# Patient Record
Sex: Female | Born: 1975 | Race: White | Hispanic: No | Marital: Married | Smoking: Never smoker
Health system: Southern US, Community
[De-identification: ages and names within clinical notes are randomized; demographics above are authoritative.]

## PROBLEM LIST (undated history)

## (undated) DIAGNOSIS — R51 Headache: Secondary | ICD-10-CM

## (undated) DIAGNOSIS — J189 Pneumonia, unspecified organism: Secondary | ICD-10-CM

## (undated) DIAGNOSIS — J45909 Unspecified asthma, uncomplicated: Secondary | ICD-10-CM

## (undated) DIAGNOSIS — G4700 Insomnia, unspecified: Secondary | ICD-10-CM

## (undated) HISTORY — DX: Insomnia, unspecified: G47.00

## (undated) HISTORY — DX: Pneumonia, unspecified organism: J18.9

## (undated) HISTORY — DX: Unspecified asthma, uncomplicated: J45.909

## (undated) HISTORY — PX: OTHER SURGICAL HISTORY: SHX169

## (undated) HISTORY — DX: Headache: R51

---

## 1997-11-30 ENCOUNTER — Other Ambulatory Visit: Admission: RE | Admit: 1997-11-30 | Discharge: 1997-11-30 | Payer: Self-pay | Admitting: Obstetrics and Gynecology

## 1998-12-01 ENCOUNTER — Other Ambulatory Visit: Admission: RE | Admit: 1998-12-01 | Discharge: 1998-12-01 | Payer: Self-pay | Admitting: Obstetrics and Gynecology

## 1999-12-13 ENCOUNTER — Other Ambulatory Visit: Admission: RE | Admit: 1999-12-13 | Discharge: 1999-12-13 | Payer: Self-pay | Admitting: *Deleted

## 2000-12-17 ENCOUNTER — Other Ambulatory Visit: Admission: RE | Admit: 2000-12-17 | Discharge: 2000-12-17 | Payer: Self-pay | Admitting: *Deleted

## 2001-10-30 ENCOUNTER — Inpatient Hospital Stay (HOSPITAL_COMMUNITY): Admission: AD | Admit: 2001-10-30 | Discharge: 2001-11-02 | Payer: Self-pay | Admitting: Obstetrics and Gynecology

## 2001-12-09 ENCOUNTER — Other Ambulatory Visit: Admission: RE | Admit: 2001-12-09 | Discharge: 2001-12-09 | Payer: Self-pay | Admitting: Obstetrics and Gynecology

## 2003-01-12 ENCOUNTER — Other Ambulatory Visit: Admission: RE | Admit: 2003-01-12 | Discharge: 2003-01-12 | Payer: Self-pay | Admitting: Obstetrics and Gynecology

## 2003-07-14 ENCOUNTER — Inpatient Hospital Stay (HOSPITAL_COMMUNITY): Admission: AD | Admit: 2003-07-14 | Discharge: 2003-07-14 | Payer: Self-pay | Admitting: Obstetrics and Gynecology

## 2003-08-03 ENCOUNTER — Encounter: Admission: RE | Admit: 2003-08-03 | Discharge: 2003-08-03 | Payer: Self-pay | Admitting: Obstetrics & Gynecology

## 2003-08-10 ENCOUNTER — Encounter: Admission: RE | Admit: 2003-08-10 | Discharge: 2003-08-10 | Payer: Self-pay | Admitting: Family Medicine

## 2003-08-17 ENCOUNTER — Encounter: Admission: RE | Admit: 2003-08-17 | Discharge: 2003-08-17 | Payer: Self-pay | Admitting: Specialist

## 2003-08-24 ENCOUNTER — Ambulatory Visit (HOSPITAL_COMMUNITY): Admission: RE | Admit: 2003-08-24 | Discharge: 2003-08-24 | Payer: Self-pay | Admitting: Obstetrics and Gynecology

## 2003-08-24 ENCOUNTER — Encounter: Admission: RE | Admit: 2003-08-24 | Discharge: 2003-08-24 | Payer: Self-pay | Admitting: *Deleted

## 2003-09-02 ENCOUNTER — Encounter: Admission: RE | Admit: 2003-09-02 | Discharge: 2003-09-02 | Payer: Self-pay | Admitting: Obstetrics & Gynecology

## 2003-09-06 ENCOUNTER — Encounter: Admission: RE | Admit: 2003-09-06 | Discharge: 2003-09-06 | Payer: Self-pay | Admitting: Obstetrics & Gynecology

## 2003-09-10 ENCOUNTER — Inpatient Hospital Stay (HOSPITAL_COMMUNITY): Admission: AD | Admit: 2003-09-10 | Discharge: 2003-09-10 | Payer: Self-pay | Admitting: Obstetrics and Gynecology

## 2003-09-14 ENCOUNTER — Encounter: Admission: RE | Admit: 2003-09-14 | Discharge: 2003-09-14 | Payer: Self-pay | Admitting: *Deleted

## 2003-09-15 ENCOUNTER — Inpatient Hospital Stay (HOSPITAL_COMMUNITY): Admission: AD | Admit: 2003-09-15 | Discharge: 2003-09-15 | Payer: Self-pay | Admitting: Obstetrics and Gynecology

## 2003-09-16 ENCOUNTER — Inpatient Hospital Stay (HOSPITAL_COMMUNITY): Admission: AD | Admit: 2003-09-16 | Discharge: 2003-09-19 | Payer: Self-pay | Admitting: Obstetrics and Gynecology

## 2003-09-16 ENCOUNTER — Encounter (INDEPENDENT_AMBULATORY_CARE_PROVIDER_SITE_OTHER): Payer: Self-pay | Admitting: Specialist

## 2003-09-23 ENCOUNTER — Inpatient Hospital Stay (HOSPITAL_COMMUNITY): Admission: AD | Admit: 2003-09-23 | Discharge: 2003-09-25 | Payer: Self-pay | Admitting: Obstetrics and Gynecology

## 2003-09-26 ENCOUNTER — Observation Stay (HOSPITAL_COMMUNITY): Admission: AD | Admit: 2003-09-26 | Discharge: 2003-09-27 | Payer: Self-pay | Admitting: Obstetrics and Gynecology

## 2003-11-01 ENCOUNTER — Other Ambulatory Visit: Admission: RE | Admit: 2003-11-01 | Discharge: 2003-11-01 | Payer: Self-pay | Admitting: Obstetrics and Gynecology

## 2004-11-28 ENCOUNTER — Other Ambulatory Visit: Admission: RE | Admit: 2004-11-28 | Discharge: 2004-11-28 | Payer: Self-pay | Admitting: Obstetrics and Gynecology

## 2007-09-22 ENCOUNTER — Emergency Department (HOSPITAL_COMMUNITY): Admission: EM | Admit: 2007-09-22 | Discharge: 2007-09-22 | Payer: Self-pay | Admitting: Family Medicine

## 2010-10-06 NOTE — H&P (Signed)
NAME:  Laura [name], Laura [name]                         ACCOUNT NO.:  [id number]   MEDICAL RECORD NO.:  [id number]                   PATIENT TYPE:  INP   LOCATION:  9374                                 FACILITY:  [facility]   PHYSICIAN:  Freddy Finner, M.D.                DATE OF BIRTH:  [birth year]   DATE OF ADMISSION:  09/23/2003  DATE OF DISCHARGE:                                HISTORY & PHYSICAL   HISTORY OF PRESENT ILLNESS:  The patient is a 35 year old white married  female, gravida 2, now para 3, who is approximately 7 days after delivery of  twins vaginally.  Her prenatal course was uncomplicated, she had  amniocentesis on September 16, 2003, with a mature LS and positive PPG; she was  induced on September 17, 2003.  She delivered without difficulty.  She was  discharged home in a routine fashion, the second or third postpartum day.  She presented to the office today complaining of constant headache,  stiffness of her neck, generalized swelling and scotomata.  A [employer]  nurse was available at the house and found her to have a blood pressure of  160/110.  She was brought to the office for further evaluation.   At that time she denied nausea, vomiting. She denied abdominal pain.  She  denied any other specific complaints, except the headache and stiff neck  primarily.  She has had a constant headache throughout the day.  Her blood  pressure in the office was 158/108.  She had 3+ protein and 3+ blood in the  urine -- this is consistent with postpartum.   She is now admitted with possible late onset pregnancy-induced hypertension,  for additional management.   REVIEW OF SYSTEMS:  Her current review of systems is as noted above.   PAST MEDICAL HISTORY:  Recorded in the prenatal summary, which should be  included in the recent old chart and will not be repeated.   PHYSICAL EXAMINATION:  HEENT:  Grossly normal.  Thyroid gland is not  palpably enlarged.  CHEST:  Clear to auscultation throughout.  HEART:  Normal sinus rhythm without murmurs, rubs or gallops.  ABDOMEN:  Soft and nontender.  There is not palpable enlargement of the  liver; no right upper quadrant tenderness.  EXTREMITIES:  Plus one edema.  Deep tendon reflexes plus two.  No evidence  of clonus.  PELVIC:  Deferred.   ASSESSMENT:  Seven days postpartum, following twin delivery -- after  uncomplicated prenatal course.  She had a normal intrapartum and immediate  postpartum course.  She is admitted now with elevation of blood pressure for  additional evaluation and management.  Freddy Finner, M.D.    WRN/MEDQ  D:  09/23/2003  T:  09/23/2003  Job:  [id number]

## 2010-10-06 NOTE — Discharge Summary (Signed)
Laura [name], Laura [name]                         ACCOUNT NO.:  [id number]   MEDICAL RECORD NO.:  [id number]                   PATIENT TYPE:  INP   LOCATION:  9374                                 FACILITY:  [facility]   PHYSICIAN:  Juluis Mire, M.D.                DATE OF BIRTH:  [birth year]   DATE OF ADMISSION:  09/23/2003  DATE OF DISCHARGE:  09/25/2003                                 DISCHARGE SUMMARY   ADMITTING DIAGNOSES:  1. Postpartum pregnancy-induced hypertension.  2. Worsening head and neck pain.   DISCHARGE DIAGNOSES:  1. Postpartum pregnancy-induced hypertension.  2. Worsening head and neck pain, with the finding of a probable muscular     strain of the neck.   HISTORY AND PHYSICAL:  For a complete history and physical, please see  written note.   COURSE IN THE HOSPITAL:  The patient was brought in and begun on magnesium  sulfate.  PIH panels were obtained, and were basically unremarkable, except  for a minimally elevated LDH.  This did resolve on follow up blood work.  She continued to complain of mainly neck discomfort, and was placed on  Motrin and Flexeril and heat with marked improved.  Subsequent CT scan of  the head and neck were completely negative.  On Sep 25, 2003, she was doing  much better.  Her blood pressure seemed to stabilize.  It was also of note  that she was placed on Labetalol early on due to elevated blood pressures  with improvement.  The magnesium was discontinued on Sep 25, 2003.  She was  voiding well and diuresing.  She denies any pregnancy-induced hypertension  symptoms, particularly no headache, scatoma, or right upper quadrant pain.  Off the magnesium she did well, blood pressure remained stable, and she was  discharged home to continue Labetalol.  She will follow up early next week  for reassessment of blood pressure.  In terms of complications, none  encountered during stay in the hospital.   CONDITION ON DISCHARGE:  The patient was discharged  home in stable  condition.   DISPOSITION:  The patient is to watch for signs of worsening pregnancy-  induced hypertension including worsening headaches, scatoma, right upper  quadrant pain, or edema.   DISCHARGE MEDICATIONS:  Discharged home on Labetalol.   FOLLOW UP:  Will follow up early next week for reassessment.                                               Juluis Mire, M.D.    JSM/MEDQ  D:  09/25/2003  T:  09/26/2003  Job:  [id number]

## 2010-10-06 NOTE — H&P (Signed)
[facility]  Patient:    Laura [name], BAGLIO Visit Number: [id number] MRN: [id number]          Service Type: OBS Location: 910B 9164 01 Attending Physician:  Donne Hazel Dictated by:   Willey Blade, M.D. Admit Date:  10/30/2001                           History and Physical  SOCIAL SECURITY NUMBER:       [id number]  HISTORY OF PRESENT ILLNESS:   The patient is a 35 year old female gravida 1 at [redacted] weeks gestation.  The patient is admitted at this time to undergo elective induction at term.  Her pregnancy has gone fairly well without major complications.  OBSTETRICAL LABORATORY DATA:  Maternal blood type O positive.  Rubella immune. Glucola normal.  Group B strep negative.  MEDICAL HISTORY:              History of asthma.  SURGICAL HISTORY:             1. Wisdom teeth extraction.                               2. Nasal polypectomy in 1993.                               3. Gum recession repair in 2002.  CURRENT MEDICATIONS:          Prenatal vitamins.  ALLERGIES:                    No known drug allergies.  PHYSICAL EXAMINATION:  VITAL SIGNS:                  Stable, blood pressure 110/70, fetal heart tones are positive in the 130s to 140s and reassuring.  GENERAL:                      She is a well-developed, well-nourished female in no acute distress.  HEENT:                        Within normal limits.  NECK:                         Supple without adenopathy or thyromegaly.  HEART:                        Regular rate and rhythm without murmur, gallop, or rub.  LUNGS:                        Clear to auscultation.  BREAST:                       Deferred.  ABDOMEN:                      Gravid and nontender.  EXTREMITIES AND NEUROLOGIC:   Grossly normal.  PELVIC:                       Normal external female genitalia.  Cervix is closed, thick and high.  Vertex presentation is noted.  ADMITTING DIAGNOSIS:  1.  Intrauterine pregnancy at term.                               2. Elective induction.  PLAN:                         1. Serial induction of labor.                               2. Anticipate normal spontaneous vaginal                                  delivery. Dictated by:   Willey Blade, M.D. Attending Physician:  Donne Hazel DD:  10/30/01 TD:  10/30/01 Job: [id number]

## 2012-02-15 ENCOUNTER — Other Ambulatory Visit: Payer: Self-pay | Admitting: Dermatology

## 2012-08-25 ENCOUNTER — Encounter: Payer: Self-pay | Admitting: Pulmonary Disease

## 2012-08-26 ENCOUNTER — Institutional Professional Consult (permissible substitution): Payer: Self-pay | Admitting: Pulmonary Disease

## 2015-04-21 ENCOUNTER — Ambulatory Visit (INDEPENDENT_AMBULATORY_CARE_PROVIDER_SITE_OTHER): Payer: BLUE CROSS/BLUE SHIELD | Admitting: Sports Medicine

## 2015-04-21 VITALS — BP 100/60 | Ht 66.0 in | Wt 135.0 lb

## 2015-04-21 DIAGNOSIS — R269 Unspecified abnormalities of gait and mobility: Secondary | ICD-10-CM

## 2015-04-21 NOTE — Assessment & Plan Note (Addendum)
Patient was fitted for a standard, cushioned, semi-rigid orthotic. The orthotic was heated and afterward the patient stood on the orthotic blank positioned on the orthotic stand. The patient was positioned in subtalar neutral position and 10 degrees of ankle dorsiflexion in a weight bearing stance. After completion of molding, a stable base was applied to the orthotic blank. The blank was ground to a stable position for weight bearing. Size: 8 Base: Blue EVA Additional Posting and Padding: 1st MTP post  The patient ambulated these, and they were very comfortable.  I spent 45 minutes with this patient, greater than 50% was face-to-face time counseling regarding the below diagnosis.

## 2015-04-21 NOTE — Progress Notes (Signed)
  Laura [name] - 39 y.o. female MRN [id number]  Date of birth: 07/05/1975 Laura [name] is a 39 y.o. female who presents today for gait abnormality and foot pain.  Ongoing foot pain 2/2 running technique.  Previous orthotics have helped somewhat but continues to have plantar foot pain.  Denies paresthesias distally or injury at this time.    PMHx - Updated and reviewed.  Contributory factors include: non contributory PSHx - Updated and reviewed.  Contributory factors include:  Non contributory  FHx - Updated and reviewed.  Contributory factors include:  Non contributory  Medications - Denies    ROS Per HPI.  12 point negative other than per HPI.   Exam:  There were no vitals filed for this visit. Gen: NAD, AAO 3 Cardiorespiratory - Normal respiratory effort/rate.  RRR Skin: No rashes or erythema Extremities: No edema, pulses +2 bilateral upper and lower extremity Feet:

## 2016-02-16 DIAGNOSIS — J111 Influenza due to unidentified influenza virus with other respiratory manifestations: Secondary | ICD-10-CM | POA: Diagnosis not present

## 2016-02-16 DIAGNOSIS — Z6826 Body mass index (BMI) 26.0-26.9, adult: Secondary | ICD-10-CM | POA: Diagnosis not present

## 2016-02-16 DIAGNOSIS — R509 Fever, unspecified: Secondary | ICD-10-CM | POA: Diagnosis not present

## 2016-02-22 DIAGNOSIS — R002 Palpitations: Secondary | ICD-10-CM | POA: Diagnosis not present

## 2016-02-22 DIAGNOSIS — Z6826 Body mass index (BMI) 26.0-26.9, adult: Secondary | ICD-10-CM | POA: Diagnosis not present

## 2016-02-22 DIAGNOSIS — J45909 Unspecified asthma, uncomplicated: Secondary | ICD-10-CM | POA: Diagnosis not present

## 2016-04-05 DIAGNOSIS — D485 Neoplasm of uncertain behavior of skin: Secondary | ICD-10-CM | POA: Diagnosis not present

## 2016-06-21 DIAGNOSIS — Z91013 Allergy to seafood: Secondary | ICD-10-CM | POA: Diagnosis not present

## 2016-06-21 DIAGNOSIS — J301 Allergic rhinitis due to pollen: Secondary | ICD-10-CM | POA: Diagnosis not present

## 2016-06-21 DIAGNOSIS — K219 Gastro-esophageal reflux disease without esophagitis: Secondary | ICD-10-CM | POA: Diagnosis not present

## 2016-06-21 DIAGNOSIS — J4540 Moderate persistent asthma, uncomplicated: Secondary | ICD-10-CM | POA: Diagnosis not present

## 2016-09-10 DIAGNOSIS — Z01419 Encounter for gynecological examination (general) (routine) without abnormal findings: Secondary | ICD-10-CM | POA: Diagnosis not present

## 2016-09-10 DIAGNOSIS — Z1231 Encounter for screening mammogram for malignant neoplasm of breast: Secondary | ICD-10-CM | POA: Diagnosis not present

## 2016-09-10 DIAGNOSIS — Z6822 Body mass index (BMI) 22.0-22.9, adult: Secondary | ICD-10-CM | POA: Diagnosis not present

## 2017-01-02 DIAGNOSIS — Z30432 Encounter for removal of intrauterine contraceptive device: Secondary | ICD-10-CM | POA: Diagnosis not present

## 2017-01-24 DIAGNOSIS — J3081 Allergic rhinitis due to animal (cat) (dog) hair and dander: Secondary | ICD-10-CM | POA: Diagnosis not present

## 2017-01-24 DIAGNOSIS — K219 Gastro-esophageal reflux disease without esophagitis: Secondary | ICD-10-CM | POA: Diagnosis not present

## 2017-01-24 DIAGNOSIS — J301 Allergic rhinitis due to pollen: Secondary | ICD-10-CM | POA: Diagnosis not present

## 2017-01-24 DIAGNOSIS — Z91013 Allergy to seafood: Secondary | ICD-10-CM | POA: Diagnosis not present

## 2017-03-03 DIAGNOSIS — M545 Low back pain: Secondary | ICD-10-CM | POA: Diagnosis not present

## 2017-04-05 DIAGNOSIS — F418 Other specified anxiety disorders: Secondary | ICD-10-CM | POA: Diagnosis not present

## 2017-04-09 DIAGNOSIS — M545 Low back pain: Secondary | ICD-10-CM | POA: Diagnosis not present

## 2017-04-16 DIAGNOSIS — M545 Low back pain: Secondary | ICD-10-CM | POA: Diagnosis not present

## 2017-04-16 DIAGNOSIS — M5126 Other intervertebral disc displacement, lumbar region: Secondary | ICD-10-CM | POA: Diagnosis not present

## 2017-04-18 DIAGNOSIS — M545 Low back pain: Secondary | ICD-10-CM | POA: Diagnosis not present

## 2017-04-18 DIAGNOSIS — M5126 Other intervertebral disc displacement, lumbar region: Secondary | ICD-10-CM | POA: Diagnosis not present

## 2017-08-29 DIAGNOSIS — J301 Allergic rhinitis due to pollen: Secondary | ICD-10-CM | POA: Diagnosis not present

## 2017-08-29 DIAGNOSIS — K219 Gastro-esophageal reflux disease without esophagitis: Secondary | ICD-10-CM | POA: Diagnosis not present

## 2017-08-29 DIAGNOSIS — J4540 Moderate persistent asthma, uncomplicated: Secondary | ICD-10-CM | POA: Diagnosis not present

## 2017-08-29 DIAGNOSIS — Z91013 Allergy to seafood: Secondary | ICD-10-CM | POA: Diagnosis not present

## 2017-12-04 DIAGNOSIS — F418 Other specified anxiety disorders: Secondary | ICD-10-CM | POA: Diagnosis not present

## 2017-12-04 DIAGNOSIS — F439 Reaction to severe stress, unspecified: Secondary | ICD-10-CM | POA: Diagnosis not present

## 2017-12-17 DIAGNOSIS — F439 Reaction to severe stress, unspecified: Secondary | ICD-10-CM | POA: Diagnosis not present

## 2017-12-17 DIAGNOSIS — F418 Other specified anxiety disorders: Secondary | ICD-10-CM | POA: Diagnosis not present

## 2018-01-01 DIAGNOSIS — J45909 Unspecified asthma, uncomplicated: Secondary | ICD-10-CM | POA: Diagnosis not present

## 2018-01-01 DIAGNOSIS — J029 Acute pharyngitis, unspecified: Secondary | ICD-10-CM | POA: Diagnosis not present

## 2018-01-01 DIAGNOSIS — J302 Other seasonal allergic rhinitis: Secondary | ICD-10-CM | POA: Diagnosis not present

## 2018-01-08 DIAGNOSIS — Z01419 Encounter for gynecological examination (general) (routine) without abnormal findings: Secondary | ICD-10-CM | POA: Diagnosis not present

## 2018-01-08 DIAGNOSIS — Z1231 Encounter for screening mammogram for malignant neoplasm of breast: Secondary | ICD-10-CM | POA: Diagnosis not present

## 2018-01-08 DIAGNOSIS — Z6823 Body mass index (BMI) 23.0-23.9, adult: Secondary | ICD-10-CM | POA: Diagnosis not present

## 2018-01-22 DIAGNOSIS — B029 Zoster without complications: Secondary | ICD-10-CM | POA: Diagnosis not present

## 2018-01-22 DIAGNOSIS — F439 Reaction to severe stress, unspecified: Secondary | ICD-10-CM | POA: Diagnosis not present

## 2018-01-22 DIAGNOSIS — F418 Other specified anxiety disorders: Secondary | ICD-10-CM | POA: Diagnosis not present

## 2018-01-29 DIAGNOSIS — B0239 Other herpes zoster eye disease: Secondary | ICD-10-CM | POA: Diagnosis not present

## 2018-02-06 DIAGNOSIS — F439 Reaction to severe stress, unspecified: Secondary | ICD-10-CM | POA: Diagnosis not present

## 2018-02-06 DIAGNOSIS — F418 Other specified anxiety disorders: Secondary | ICD-10-CM | POA: Diagnosis not present

## 2018-03-05 DIAGNOSIS — F418 Other specified anxiety disorders: Secondary | ICD-10-CM | POA: Diagnosis not present

## 2018-03-05 DIAGNOSIS — F439 Reaction to severe stress, unspecified: Secondary | ICD-10-CM | POA: Diagnosis not present

## 2018-03-14 DIAGNOSIS — F418 Other specified anxiety disorders: Secondary | ICD-10-CM | POA: Diagnosis not present

## 2018-03-14 DIAGNOSIS — F439 Reaction to severe stress, unspecified: Secondary | ICD-10-CM | POA: Diagnosis not present

## 2018-04-01 DIAGNOSIS — F439 Reaction to severe stress, unspecified: Secondary | ICD-10-CM | POA: Diagnosis not present

## 2018-04-01 DIAGNOSIS — F418 Other specified anxiety disorders: Secondary | ICD-10-CM | POA: Diagnosis not present

## 2018-04-09 DIAGNOSIS — D224 Melanocytic nevi of scalp and neck: Secondary | ICD-10-CM | POA: Diagnosis not present

## 2018-04-09 DIAGNOSIS — D2261 Melanocytic nevi of right upper limb, including shoulder: Secondary | ICD-10-CM | POA: Diagnosis not present

## 2018-04-09 DIAGNOSIS — Z85828 Personal history of other malignant neoplasm of skin: Secondary | ICD-10-CM | POA: Diagnosis not present

## 2018-04-09 DIAGNOSIS — D485 Neoplasm of uncertain behavior of skin: Secondary | ICD-10-CM | POA: Diagnosis not present

## 2018-04-09 DIAGNOSIS — D225 Melanocytic nevi of trunk: Secondary | ICD-10-CM | POA: Diagnosis not present

## 2018-04-09 DIAGNOSIS — D2262 Melanocytic nevi of left upper limb, including shoulder: Secondary | ICD-10-CM | POA: Diagnosis not present

## 2018-05-02 DIAGNOSIS — M545 Low back pain: Secondary | ICD-10-CM | POA: Diagnosis not present

## 2018-05-02 DIAGNOSIS — M5126 Other intervertebral disc displacement, lumbar region: Secondary | ICD-10-CM | POA: Diagnosis not present

## 2018-06-05 DIAGNOSIS — M5136 Other intervertebral disc degeneration, lumbar region: Secondary | ICD-10-CM | POA: Diagnosis not present

## 2018-06-19 DIAGNOSIS — M5136 Other intervertebral disc degeneration, lumbar region: Secondary | ICD-10-CM | POA: Diagnosis not present

## 2018-06-19 DIAGNOSIS — M545 Low back pain: Secondary | ICD-10-CM | POA: Diagnosis not present

## 2018-07-03 DIAGNOSIS — M5440 Lumbago with sciatica, unspecified side: Secondary | ICD-10-CM | POA: Diagnosis not present

## 2018-07-17 ENCOUNTER — Telehealth: Payer: Self-pay | Admitting: Cardiology

## 2018-07-17 DIAGNOSIS — M545 Low back pain: Secondary | ICD-10-CM | POA: Diagnosis not present

## 2018-07-17 NOTE — Telephone Encounter (Signed)
Spoke with pt, Follow up scheduled  

## 2018-07-17 NOTE — Telephone Encounter (Signed)
Follow Up    PT is calling back to speak with Debra. She would like to schedule an appt.  [phone]

## 2018-07-17 NOTE — Telephone Encounter (Signed)
New Message ° ° °Patient returning Debra's call. °

## 2018-07-17 NOTE — Telephone Encounter (Signed)
Pt called back to talk to [facility]. Please call at your earliest convenience

## 2018-07-18 ENCOUNTER — Ambulatory Visit: Payer: BLUE CROSS/BLUE SHIELD | Admitting: Cardiology

## 2018-07-18 ENCOUNTER — Encounter: Payer: Self-pay | Admitting: Cardiology

## 2018-07-18 VITALS — BP 104/76 | HR 69 | Ht 66.0 in | Wt 155.0 lb

## 2018-07-18 DIAGNOSIS — R002 Palpitations: Secondary | ICD-10-CM

## 2018-07-18 NOTE — Progress Notes (Signed)
Referring-Ravisankar Avva MD Reason for referral-palpitations  HPI: 43 year old female for evaluation of palpitations at request of Ravisankar Avva MD. Patient has had intermittent palpitations since college.  They are described as a brief flutter and not sustained.  Had an episode recently lasting 5 seconds.  Otherwise very active and denies dyspnea on exertion, orthopnea, PND, pedal edema, syncope or exertional chest pain.  Current Outpatient Medications  Medication Sig Dispense Refill  . SYMBICORT 160-4.5 MCG/ACT inhaler   4   No current facility-administered medications for this visit.     Allergies  Allergen Reactions  . Shellfish Allergy Hives     Past Medical History:  Diagnosis Date  . Asthma   . Headache(784.0)   . Insomnia   . Pneumonia     Past Surgical History:  Procedure Laterality Date  . nasal polyp removal      Social History   Socioeconomic History  . Marital status: Married    Spouse name: Not on file  . Number of children: 3  . Years of education: Not on file  . Highest education level: Not on file  Occupational History  . Not on file  Social Needs  . Financial resource strain: Not on file  . Food insecurity:    Worry: Not on file    Inability: Not on file  . Transportation needs:    Medical: Not on file    Non-medical: Not on file  Tobacco Use  . Smoking status: Never Smoker  . Smokeless tobacco: Never Used  Substance and Sexual Activity  . Alcohol use: Yes    Comment: Occasional  . Drug use: Yes  . Sexual activity: Not on file  Lifestyle  . Physical activity:    Days per week: Not on file    Minutes per session: Not on file  . Stress: Not on file  Relationships  . Social connections:    Talks on phone: Not on file    Gets together: Not on file    Attends religious service: Not on file    Active member of club or organization: Not on file    Attends meetings of clubs or organizations: Not on file    Relationship status:  Not on file  . Intimate partner violence:    Fear of current or ex partner: Not on file    Emotionally abused: Not on file    Physically abused: Not on file    Forced sexual activity: Not on file  Other Topics Concern  . Not on file  Social History Narrative  . Not on file    Family History  Problem Relation Age of Onset  . Asthma Father   . Ulcers Father   . Cancer Other        grandparents  . Asthma Sister   . Asthma Sister   . Alzheimer's disease Mother     ROS: no fevers or chills, productive cough, hemoptysis, dysphasia, odynophagia, melena, hematochezia, dysuria, hematuria, rash, seizure activity, orthopnea, PND, pedal edema, claudication. Remaining systems are negative.  Physical Exam:   Blood pressure 104/76, pulse 69, height 5\' 6"  (1.676 m), weight 155 lb (70.3 kg).  General:  Well developed/well nourished in NAD Skin warm/dry Patient not depressed No peripheral clubbing Back-normal HEENT-normal/normal eyelids Neck supple/normal carotid upstroke bilaterally; no bruits; no JVD; no thyromegaly chest - CTA/ normal expansion CV - RRR/normal S1 and S2; no murmurs, rubs or gallops;  PMI nondisplaced Abdomen -NT/ND, no HSM, no mass, + bowel  sounds, no bruit 2+ femoral pulses, no bruits Ext-no edema, chords, 2+ DP Neuro-grossly nonfocal  ECG -sinus rhythm at a rate of 69, no ST changes.  Personally reviewed  A/P  1 palpitations-etiology unclear.  Electrocardiogram is normal.  I will arrange an echocardiogram to assess LV function.  Check TSH.  We will place an event monitor to further assess.  Can consider beta-blocker in the future if symptoms worsen.  2 asthma-management per primary care.  Olga Millers, MD

## 2018-07-18 NOTE — Patient Instructions (Signed)
Medication Instructions:  NO CHANGE If you need a refill on your cardiac medications before your next appointment, please call your pharmacy.   Lab work: Your physician recommends that you HAVE LAB WORK TODAY If you have labs (blood work) drawn today and your tests are completely normal, you will receive your results only by: Marland Kitchen MyChart Message (if you have MyChart) OR . A paper copy in the mail If you have any lab test that is abnormal or we need to change your treatment, we will call you to review the results.  Testing/Procedures: Your physician has requested that you have an echocardiogram. Echocardiography is a painless test that uses sound waves to create images of your heart. It provides your doctor with information about the size and shape of your heart and how well your heart's chambers and valves are working. This procedure takes approximately one hour. There are no restrictions for this procedure.  [address] [facility]  Your physician has recommended that you wear an event monitor. Event monitors are medical devices that record the heart's electrical activity. Doctors most often [place] these monitors to diagnose arrhythmias. Arrhythmias are problems with the speed or rhythm of the heartbeat. The monitor is a small, portable device. You can wear one while you do your normal daily activities. This is usually used to diagnose what is causing palpitations/syncope (passing out).  [address]  Follow-Up: At [facility], you and your health needs are our priority.  As part of our continuing mission to provide you with exceptional heart care, we have created designated Provider Care Teams.  These Care Teams include your primary Cardiologist (physician) and Advanced Practice Providers (APPs -  Physician Assistants and Nurse Practitioners) who all work together to provide you with the care you need, when you need it. Your physician recommends that you schedule a follow-up  appointment in: 3 MONTHS WITH DR Jens Som

## 2018-07-19 LAB — TSH: TSH: 1.94 u[IU]/mL (ref 0.450–4.500)

## 2018-07-21 ENCOUNTER — Encounter: Payer: Self-pay | Admitting: *Deleted

## 2018-07-29 ENCOUNTER — Ambulatory Visit (INDEPENDENT_AMBULATORY_CARE_PROVIDER_SITE_OTHER): Payer: BLUE CROSS/BLUE SHIELD

## 2018-07-29 ENCOUNTER — Ambulatory Visit (HOSPITAL_COMMUNITY): Payer: BLUE CROSS/BLUE SHIELD | Attending: Cardiovascular Disease

## 2018-07-29 DIAGNOSIS — R002 Palpitations: Secondary | ICD-10-CM | POA: Diagnosis not present

## 2018-10-10 ENCOUNTER — Ambulatory Visit: Payer: BLUE CROSS/BLUE SHIELD | Admitting: Cardiology

## 2018-10-10 NOTE — Progress Notes (Deleted)
Virtual Visit via Video Note   This visit type was conducted due to national recommendations for restrictions regarding the COVID-19 Pandemic (e.g. social distancing) in an effort to limit this patient's exposure and mitigate transmission in our community.  Due to her co-morbid illnesses, this patient is at least at moderate risk for complications without adequate follow up.  This format is felt to be most appropriate for this patient at this time.  All issues noted in this document were discussed and addressed.  A limited physical exam was performed with this format.  Please refer to the patient's chart for her consent to telehealth for [facility].   Date:  10/10/2018   ID:  Laura [name], DOB 01-10-1976, MRN [id number]  Patient Location: Home Provider Location: Home  PCP:  Chilton Greathouse, MD  Cardiologist:  Dr Jens Som  Evaluation Performed:  Follow-Up Visit  Chief Complaint:  FU palpitations  History of Present Illness:    FU palpitations.   TSH February 2020 normal.  Echocardiogram March 2020 showed normal LV function.  Monitor March 2020 showed rare PAC and PVC.  Since last seen  The patient does not have symptoms concerning for COVID-19 infection (fever, chills, cough, or new shortness of breath).    Past Medical History:  Diagnosis Date  . Asthma   . Headache(784.0)   . Insomnia   . Pneumonia    Past Surgical History:  Procedure Laterality Date  . nasal polyp removal       No outpatient medications have been marked as taking for the 10/10/18 encounter (Appointment) with Lewayne Bunting, MD.     Allergies:   Shellfish allergy   Social History   Tobacco Use  . Smoking status: Never Smoker  . Smokeless tobacco: Never Used  Substance Use Topics  . Alcohol use: Yes    Comment: Occasional  . Drug use: Yes     Family Hx: The patient's family history includes Alzheimer's disease in her mother; Asthma in her father, sister, and sister; Cancer in an other  family member; Ulcers in her father.  ROS:   Please see the history of present illness.    No fevers, chills or productive cough. All other systems reviewed and are negative.  Recent Labs: 07/18/2018: TSH 1.940    Wt Readings from Last 3 Encounters:  07/18/18 155 lb (70.3 kg)  04/21/15 135 lb (61.2 kg)     Objective:    Vital Signs:  There were no vitals taken for this visit.   VITAL SIGNS:  reviewed  No acute distress Normal affect Answers questions appropriately  remainder physical examination not performed (telehealth visit; coronavirus pandemic)  ASSESSMENT & PLAN:    1. Palpitations-symptoms have improved.  Echocardiogram shows normal LV function and monitor shows occasional PAC and PVC.  We can consider a beta-blocker in the future if needed. 2. Asthma-management per primary care.  COVID-19 Education: The importance of social distancing was discussed today.  Time:   Today, I have spent 12 minutes with the patient with telehealth technology discussing the above problems.     Medication Adjustments/Labs and Tests Ordered: Current medicines are reviewed at length with the patient today.  Concerns regarding medicines are outlined above.   Tests Ordered: No orders of the defined types were placed in this encounter.   Medication Changes: No orders of the defined types were placed in this encounter.   Disposition:  Follow up {follow up:15908}  Signed, Olga Millers, MD  10/10/2018 7:39 AM  [facility]

## 2018-10-16 DIAGNOSIS — F418 Other specified anxiety disorders: Secondary | ICD-10-CM | POA: Diagnosis not present

## 2018-10-16 DIAGNOSIS — F439 Reaction to severe stress, unspecified: Secondary | ICD-10-CM | POA: Diagnosis not present

## 2019-01-08 ENCOUNTER — Other Ambulatory Visit: Payer: Self-pay

## 2019-01-08 ENCOUNTER — Encounter (HOSPITAL_COMMUNITY): Payer: Self-pay | Admitting: Emergency Medicine

## 2019-01-08 DIAGNOSIS — R1031 Right lower quadrant pain: Secondary | ICD-10-CM | POA: Insufficient documentation

## 2019-01-08 DIAGNOSIS — J45909 Unspecified asthma, uncomplicated: Secondary | ICD-10-CM | POA: Diagnosis not present

## 2019-01-08 DIAGNOSIS — K529 Noninfective gastroenteritis and colitis, unspecified: Secondary | ICD-10-CM | POA: Diagnosis not present

## 2019-01-08 DIAGNOSIS — R197 Diarrhea, unspecified: Secondary | ICD-10-CM | POA: Diagnosis not present

## 2019-01-08 MED ORDER — SODIUM CHLORIDE 0.9% FLUSH: 3.0000 mL | Freq: Once | INTRAVENOUS | Status: DC

## 2019-01-08 NOTE — ED Triage Notes (Signed)
Patient states she has not been feeling good today. Patient states that she started having severe right upper abdominal pain around nine pm. Patient states she also has had diarrhea.

## 2019-01-09 ENCOUNTER — Emergency Department (HOSPITAL_COMMUNITY)
Admission: EM | Admit: 2019-01-09 | Discharge: 2019-01-09 | Disposition: A | Discharge status: Home / Self Care | Payer: BC Managed Care – PPO | Attending: Emergency Medicine | Admitting: Emergency Medicine

## 2019-01-09 ENCOUNTER — Encounter (HOSPITAL_COMMUNITY): Payer: Self-pay

## 2019-01-09 ENCOUNTER — Emergency Department (HOSPITAL_COMMUNITY): Payer: BC Managed Care – PPO

## 2019-01-09 DIAGNOSIS — R1031 Right lower quadrant pain: Secondary | ICD-10-CM

## 2019-01-09 DIAGNOSIS — R197 Diarrhea, unspecified: Secondary | ICD-10-CM | POA: Diagnosis not present

## 2019-01-09 DIAGNOSIS — K529 Noninfective gastroenteritis and colitis, unspecified: Secondary | ICD-10-CM | POA: Diagnosis not present

## 2019-01-09 LAB — COMPREHENSIVE METABOLIC PANEL
ALT: 19 U/L (ref 0–44)
AST: 21 U/L (ref 15–41)
Albumin: 4.1 g/dL (ref 3.5–5.0)
Alkaline Phosphatase: 59 U/L (ref 38–126)
Anion gap: 8 (ref 5–15)
BUN: 17 mg/dL (ref 6–20)
CO2: 22 mmol/L (ref 22–32)
Calcium: 9 mg/dL (ref 8.9–10.3)
Chloride: 106 mmol/L (ref 98–111)
Creatinine, Ser: 0.82 mg/dL (ref 0.44–1.00)
GFR calc Af Amer: >60 mL/min (ref >60)
GFR calc non Af Amer: >60 mL/min (ref >60)
Glucose, Bld: 121 mg/dL — ABNORMAL HIGH (ref 70–99)
Potassium: 3.7 mmol/L (ref 3.5–5.1)
Sodium: 136 mmol/L (ref 135–145)
Total Bilirubin: 0.6 mg/dL (ref 0.3–1.2)
Total Protein: 7.2 g/dL (ref 6.5–8.1)

## 2019-01-09 LAB — URINALYSIS, ROUTINE W REFLEX MICROSCOPIC
Bacteria, UA: NONE SEEN
Bilirubin Urine: NEGATIVE
Glucose, UA: NEGATIVE mg/dL
Hgb urine dipstick: NEGATIVE
Ketones, ur: NEGATIVE mg/dL
Nitrite: NEGATIVE
Protein, ur: NEGATIVE mg/dL
Specific Gravity, Urine: 1.021 (ref 1.005–1.030)
pH: 7 (ref 5.0–8.0)

## 2019-01-09 LAB — I-STAT BETA HCG BLOOD, ED (MC, WL, AP ONLY): I-stat hCG, quantitative: <5 m[IU]/mL (ref <5)

## 2019-01-09 LAB — CBC
HCT: 42.8 % (ref 36.0–46.0)
Hemoglobin: 14.1 g/dL (ref 12.0–15.0)
MCH: 31.5 pg (ref 26.0–34.0)
MCHC: 32.9 g/dL (ref 30.0–36.0)
MCV: 95.5 fL (ref 80.0–100.0)
Platelets: 144 10*3/uL — ABNORMAL LOW (ref 150–400)
RBC: 4.48 MIL/uL (ref 3.87–5.11)
RDW: 12.1 % (ref 11.5–15.5)
WBC: 4.8 10*3/uL (ref 4.0–10.5)
nRBC: 0 % (ref 0.0–0.2)

## 2019-01-09 LAB — LIPASE, BLOOD: Lipase: 34 U/L (ref 11–51)

## 2019-01-09 MED ORDER — SODIUM CHLORIDE 0.9 % IV BOLUS
1000.0000 mL | Freq: Once | INTRAVENOUS | Status: AC
  Administered 2019-01-09: 1000 mL via INTRAVENOUS

## 2019-01-09 MED ORDER — IOHEXOL 300 MG/ML  SOLN
100.0000 mL | Freq: Once | INTRAMUSCULAR | Status: AC | PRN
  Administered 2019-01-09: 02:00:00 100 mL via INTRAVENOUS

## 2019-01-09 MED ORDER — LACTINEX PO CHEW: 1.0000 | CHEWABLE_TABLET | Freq: Three times a day (TID) | ORAL | 0 refills | Status: AC

## 2019-01-09 MED ORDER — SODIUM CHLORIDE (PF) 0.9 % IJ SOLN
INTRAMUSCULAR | Status: AC
  Filled 2019-01-09: qty 50

## 2019-01-09 MED ORDER — ONDANSETRON 4 MG PO TBDP: 4.0000 mg | ORAL_TABLET | Freq: Three times a day (TID) | ORAL | 0 refills | Status: AC | PRN

## 2019-01-09 NOTE — ED Provider Notes (Signed)
[facility] DEPT Provider Note   CSN: [id number] Arrival date & time: 01/08/19  2300     History   Chief Complaint Chief Complaint  Patient presents with  . Abdominal Pain    HPI Laura [name] is [name] 43 y.o. female.     43yo female with past medical history of asthma presents with complaint of right side abdominal pain.  Patient states that she has not felt well all day today, reports nausea and diarrhea.  Patient ate [name] burger for dinner tonight, when she was getting ready to go to bed she developed sudden onset of severe right-sided abdominal pain described as [name] sharp labor pain.  Pain radiates down towards the lower right abdomen, was worse with deep breaths.  Pain has eased somewhat at this point but is still uncomfortable.  No changes in bladder habits, no known sick contacts, no recent travel, no history of similar pain previously.  No prior abdominal surgeries.  No other complaints or concerns.     Past Medical History:  Diagnosis Date  . Asthma   . Headache(784.0)   . Insomnia   . Pneumonia     Patient Active Problem List   Diagnosis Date Noted  . Abnormality of gait 04/21/2015    Past Surgical History:  Procedure Laterality Date  . nasal polyp removal       OB History   No obstetric history on file.      Home Medications    Prior to Admission medications   Medication Sig Start Date End Date Taking? Authorizing Provider  SYMBICORT 160-4.5 MCG/ACT inhaler  01/17/15   [provider]    Family History Family History  Problem Relation Age of Onset  . Asthma Father   . Ulcers Father   . Cancer Other        grandparents  . Asthma Sister   . Asthma Sister   . Alzheimer's disease Mother     Social History Social History   Tobacco Use  . Smoking status: Never Smoker  . Smokeless tobacco: Never Used  Substance Use Topics  . Alcohol use: Yes    Comment: Occasional  . Drug use: Yes     Allergies    Shellfish allergy   Review of Systems Review of Systems  Constitutional: Negative for chills and fever.  Respiratory: Negative for shortness of breath.   Cardiovascular: Negative for chest pain.  Gastrointestinal: Positive for abdominal pain, diarrhea and nausea. Negative for constipation and vomiting.  Genitourinary: Negative for dysuria, frequency and urgency.  Musculoskeletal: Negative for back pain.  Skin: Negative for rash and wound.  Allergic/Immunologic: Negative for immunocompromised state.  Neurological: Negative for weakness.  Psychiatric/Behavioral: Negative for confusion.  All other systems reviewed and are negative.    Physical Exam Updated Vital Signs BP 121/72 (BP Location: Left Arm)   Pulse 77   Temp 98.3 F (36.8 C) (Oral)   Resp 16   Ht 5\' 6"  (1.676 m)   Wt 65.8 kg   SpO2 100%   BMI 23.40 kg/m   Physical Exam Vitals signs and nursing note reviewed.  Constitutional:      General: She is not in acute distress.    Appearance: She is well-developed. She is not diaphoretic.  HENT:     Head: Normocephalic and atraumatic.  Cardiovascular:     Rate and Rhythm: Normal rate and regular rhythm.     Heart sounds: Normal heart sounds.  Pulmonary:  Effort: Pulmonary effort is normal.  Abdominal:     General: Abdomen is flat.     Palpations: Abdomen is soft.     Tenderness: There is abdominal tenderness in the right lower quadrant. There is no right CVA tenderness or left CVA tenderness.  Skin:    General: Skin is warm and dry.  Neurological:     Mental Status: She is alert and oriented to person, place, and time.  Psychiatric:        Behavior: Behavior normal.      ED Treatments / Results  Labs (all labs ordered are listed, but only abnormal results are displayed) Labs Reviewed  CBC - Abnormal; Notable for the following components:      Result Value   Platelets 144 (*)    All other components within normal limits  URINALYSIS, ROUTINE W REFLEX  MICROSCOPIC - Abnormal; Notable for the following components:   Leukocytes,Ua TRACE (*)    All other components within normal limits  LIPASE, BLOOD  COMPREHENSIVE METABOLIC PANEL  I-STAT BETA HCG BLOOD, ED ([facility], [facility], [facility] ONLY)    EKG None  Radiology No results found.  Procedures Procedures (including critical care time)  Medications Ordered in ED Medications  sodium chloride flush (NS) 0.9 % injection 3 mL (3 mLs Intravenous Not Given 01/09/19 0049)  sodium chloride 0.9 % bolus 1,000 mL (1,000 mLs Intravenous New Bag/Given 01/09/19 0049)     Initial Impression / Assessment and Plan / ED Course  I have reviewed the triage vital signs and the nursing notes.  Pertinent labs & imaging results that were available during my care of the patient were reviewed by me and considered in my medical decision making (see chart for details).  Clinical Course as of Jan 08 105  Fri Jan 09, 2019  [id number] 43 year old female presents with complaint of right side abdominal pain onset tonight, states she has not felt well all day today with nausea and diarrhea.  Exam patient has tenderness in her right lower quadrant, negative Murphy sign. Review of lab work, urinalysis with trace leukocytes, CBC with platelets of 144 otherwise normal white count.  hCG negative, CMP and lipase pending as well as CT scan abdomen pelvis with contrast.  Care signed out to Antony MaduraKelly Humes, Laura [name] pending CMP, Lipase, CT scan.   [LM]    Clinical Course User Index [LM] Laura [name], Laura [name], Laura [name]      Final Clinical Impressions(s) / ED Diagnoses   Final diagnoses:  Right lower quadrant abdominal pain  Diarrhea, unspecified type    ED Discharge Orders    None       Laura [name], Laura [name], Laura [name] 01/09/19 0107    Nira Connardama, Pedro Eduardo, MD 01/09/19 [id number]

## 2019-01-09 NOTE — ED Provider Notes (Signed)
3:07 AM Patient care assumed from Suella Broad, PA-C at change of shift pending CT scan.  On reassessment, patient states that she is feeling much better.  She has no complaints of pain.  Has been given IV fluids only.  Her CT scan shows mild thickening of the terminal ileum and colon.  She had nausea and diarrhea which began prior to arrival.  Clinically, her symptoms are consistent with likely enterocolitis.  Suspect food-borne versus viral etiology.  Labs reassuring.  No fevers or leukocytosis.  Will plan for outpatient management with hydration, probiotics.  Short course of Zofran given to ensure ability to tolerate PO.  Encouraged PCP follow up.  Return precautions discussed and provided. Patient discharged in stable condition with no unaddressed concerns.  Results for orders placed or performed during the hospital encounter of 01/09/19  Lipase, blood  Result Value Ref Range   Lipase 34 11 - 51 U/L  Comprehensive metabolic panel  Result Value Ref Range   Sodium 136 135 - 145 mmol/L   Potassium 3.7 3.5 - 5.1 mmol/L   Chloride 106 98 - 111 mmol/L   CO2 22 22 - 32 mmol/L   Glucose, Bld 121 (H) 70 - 99 mg/dL   BUN 17 6 - 20 mg/dL   Creatinine, Ser 0.82 0.44 - 1.00 mg/dL   Calcium 9.0 8.9 - 10.3 mg/dL   Total Protein 7.2 6.5 - 8.1 g/dL   Albumin 4.1 3.5 - 5.0 g/dL   AST 21 15 - 41 U/L   ALT 19 0 - 44 U/L   Alkaline Phosphatase 59 38 - 126 U/L   Total Bilirubin 0.6 0.3 - 1.2 mg/dL   GFR calc non Af Amer >60 >60 mL/min   GFR calc Af Amer >60 >60 mL/min   Anion gap 8 5 - 15  CBC  Result Value Ref Range   WBC 4.8 4.0 - 10.5 K/uL   RBC 4.48 3.87 - 5.11 MIL/uL   Hemoglobin 14.1 12.0 - 15.0 g/dL   HCT 42.8 36.0 - 46.0 %   MCV 95.5 80.0 - 100.0 fL   MCH 31.5 26.0 - 34.0 pg   MCHC 32.9 30.0 - 36.0 g/dL   RDW 12.1 11.5 - 15.5 %   Platelets 144 (L) 150 - 400 K/uL   nRBC 0.0 0.0 - 0.2 %  Urinalysis, Routine w reflex microscopic  Result Value Ref Range   Color, Urine YELLOW YELLOW    APPearance CLEAR CLEAR   Specific Gravity, Urine 1.021 1.005 - 1.030   pH 7.0 5.0 - 8.0   Glucose, UA NEGATIVE NEGATIVE mg/dL   Hgb urine dipstick NEGATIVE NEGATIVE   Bilirubin Urine NEGATIVE NEGATIVE   Ketones, ur NEGATIVE NEGATIVE mg/dL   Protein, ur NEGATIVE NEGATIVE mg/dL   Nitrite NEGATIVE NEGATIVE   Leukocytes,Ua TRACE (A) NEGATIVE   RBC / HPF 0-5 0 - 5 RBC/hpf   WBC, UA 6-10 0 - 5 WBC/hpf   Bacteria, UA NONE SEEN NONE SEEN   Squamous Epithelial / LPF 0-5 0 - 5   Mucus PRESENT   I-Stat beta hCG blood, ED  Result Value Ref Range   I-stat hCG, quantitative <5.0 <5 mIU/mL   Comment 3           Ct Abdomen Pelvis W Contrast  Result Date: 01/09/2019 CLINICAL DATA:  Acute, generalized abdominal pain with diarrhea EXAM: CT ABDOMEN AND PELVIS WITH CONTRAST TECHNIQUE: Multidetector CT imaging of the abdomen and pelvis was performed using the standard protocol following  bolus administration of intravenous contrast. CONTRAST:  100mL OMNIPAQUE IOHEXOL 300 MG/ML  SOLN COMPARISON:  Lumbar MRI 06/19/2018 FINDINGS: Lower chest: Few bandlike areas of scarring and/or atelectasis. Lung bases are otherwise clear. Normal heart size. No pericardial effusion. Hepatobiliary: Subcentimeter hypoattenuating focus near the dome of the right liver (2/80) too small to fully characterize on CT imaging but statistically likely benign. No concerning liver abnormality is seen. Gallbladder is largely decompressed at the time of exam and therefore poorly evaluated by CT. No visible gallstones, gallbladder wall thickening, or biliary dilatation. Pancreas: Unremarkable. No pancreatic ductal dilatation or surrounding inflammatory changes. Spleen: Normal in size without focal abnormality. Adrenals/Urinary Tract: Adrenal glands are unremarkable. Kidneys are normal, without renal calculi, focal lesion, or hydronephrosis. Bladder is unremarkable. Stomach/Bowel: Difficult evaluation of the bowel and mesentery due to a paucity of  intraperitoneal fat. Distal esophagus, stomach and duodenal sweep are unremarkable. There is a cluster of small bowel loops in the right upper quadrant which demonstrate internal fecalization of the contents. No abrupt transition point is identified however. There is mild mural thickening and mucosal hyperenhancement of the terminal ileum and slight mural thickening of the colon throughout its course most pronounced towards the level of the rectosigmoid. No extraluminal gas or collection is seen Vascular/Lymphatic: No significant vascular findings are present. No enlarged abdominal or pelvic lymph nodes. Reproductive: Anteverted uterus. Heterogeneous enhancement of the uterus may be related to contrast timing. No concerning adnexal lesions. Other: No abdominopelvic free fluid or free gas. No bowel containing hernias. Musculoskeletal: Global disc bulge at L5-S1 with no significant stenosis. Osseous structures are otherwise unremarkable. IMPRESSION: Mild mural thickening and mucosal hyperenhancement of the terminal ileum and slight mural thickening of the colon throughout its course most pronounced towards the level of the rectosigmoid, suggestive of infectious/inflammatory enterocolitis. No evidence of perforation or abscess formation. Cluster of small bowel loops in the right upper quadrant with fecalized contents, nonspecific but may suggest slowed intestinal transit. Electronically Signed   By: Kreg ShropshirePrice  DeHay M.D.   On: 01/09/2019 02:36      Antony MaduraHumes, Asheley Hellberg, PA-C 01/09/19 0309    Nira Connardama, Pedro Eduardo, MD 01/09/19 [phone]

## 2019-01-09 NOTE — Discharge Instructions (Signed)
Avoid fried foods, fatty foods, greasy foods, and milk products until symptoms resolve. Drink plenty of clear liquids. We recommend the use of Zofran as prescribed for nausea/vomiting. Take a daily probiotic to help relieve diarrhea. Follow-up with your primary care doctor to ensure resolution of symptoms.

## 2019-01-13 ENCOUNTER — Other Ambulatory Visit: Payer: Self-pay

## 2019-01-13 DIAGNOSIS — Z20822 Contact with and (suspected) exposure to covid-19: Secondary | ICD-10-CM

## 2019-01-14 DIAGNOSIS — F439 Reaction to severe stress, unspecified: Secondary | ICD-10-CM | POA: Diagnosis not present

## 2019-01-14 DIAGNOSIS — F418 Other specified anxiety disorders: Secondary | ICD-10-CM | POA: Diagnosis not present

## 2019-01-15 LAB — NOVEL CORONAVIRUS, NAA: SARS-CoV-2, NAA: NOT DETECTED

## 2019-01-19 ENCOUNTER — Ambulatory Visit: Payer: BC Managed Care – PPO | Admitting: Cardiology

## 2019-01-21 DIAGNOSIS — R1011 Right upper quadrant pain: Secondary | ICD-10-CM | POA: Diagnosis not present

## 2019-01-21 DIAGNOSIS — R198 Other specified symptoms and signs involving the digestive system and abdomen: Secondary | ICD-10-CM | POA: Diagnosis not present

## 2019-01-21 DIAGNOSIS — K582 Mixed irritable bowel syndrome: Secondary | ICD-10-CM | POA: Diagnosis not present

## 2019-01-21 DIAGNOSIS — R933 Abnormal findings on diagnostic imaging of other parts of digestive tract: Secondary | ICD-10-CM | POA: Diagnosis not present

## 2019-01-23 ENCOUNTER — Other Ambulatory Visit: Payer: Self-pay | Admitting: Physician Assistant

## 2019-01-23 DIAGNOSIS — R1011 Right upper quadrant pain: Secondary | ICD-10-CM

## 2019-02-02 ENCOUNTER — Ambulatory Visit
Admission: RE | Admit: 2019-02-02 | Discharge: 2019-02-02 | Disposition: A | Discharge status: Home / Self Care | Payer: BC Managed Care – PPO | Source: Ambulatory Visit | Attending: Physician Assistant | Admitting: Physician Assistant

## 2019-02-02 DIAGNOSIS — R1011 Right upper quadrant pain: Secondary | ICD-10-CM | POA: Diagnosis not present

## 2019-02-03 DIAGNOSIS — F418 Other specified anxiety disorders: Secondary | ICD-10-CM | POA: Diagnosis not present

## 2019-02-03 DIAGNOSIS — F439 Reaction to severe stress, unspecified: Secondary | ICD-10-CM | POA: Diagnosis not present

## 2019-02-18 DIAGNOSIS — L821 Other seborrheic keratosis: Secondary | ICD-10-CM | POA: Diagnosis not present

## 2019-02-18 DIAGNOSIS — D692 Other nonthrombocytopenic purpura: Secondary | ICD-10-CM | POA: Diagnosis not present

## 2019-02-18 DIAGNOSIS — Z85828 Personal history of other malignant neoplasm of skin: Secondary | ICD-10-CM | POA: Diagnosis not present

## 2019-03-09 DIAGNOSIS — Z23 Encounter for immunization: Secondary | ICD-10-CM | POA: Diagnosis not present

## 2019-03-10 DIAGNOSIS — Z6824 Body mass index (BMI) 24.0-24.9, adult: Secondary | ICD-10-CM | POA: Diagnosis not present

## 2019-03-10 DIAGNOSIS — Z1231 Encounter for screening mammogram for malignant neoplasm of breast: Secondary | ICD-10-CM | POA: Diagnosis not present

## 2019-03-10 DIAGNOSIS — Z01419 Encounter for gynecological examination (general) (routine) without abnormal findings: Secondary | ICD-10-CM | POA: Diagnosis not present

## 2019-03-16 DIAGNOSIS — Z1159 Encounter for screening for other viral diseases: Secondary | ICD-10-CM | POA: Diagnosis not present

## 2019-03-19 DIAGNOSIS — D122 Benign neoplasm of ascending colon: Secondary | ICD-10-CM | POA: Diagnosis not present

## 2019-03-19 DIAGNOSIS — D123 Benign neoplasm of transverse colon: Secondary | ICD-10-CM | POA: Diagnosis not present

## 2019-03-19 DIAGNOSIS — R933 Abnormal findings on diagnostic imaging of other parts of digestive tract: Secondary | ICD-10-CM | POA: Diagnosis not present

## 2019-03-19 DIAGNOSIS — R194 Change in bowel habit: Secondary | ICD-10-CM | POA: Diagnosis not present

## 2019-03-19 DIAGNOSIS — Z86010 Personal history of colonic polyps: Secondary | ICD-10-CM | POA: Diagnosis not present

## 2019-03-19 DIAGNOSIS — R1011 Right upper quadrant pain: Secondary | ICD-10-CM | POA: Diagnosis not present

## 2019-03-19 DIAGNOSIS — K640 First degree hemorrhoids: Secondary | ICD-10-CM | POA: Diagnosis not present

## 2019-03-23 DIAGNOSIS — J301 Allergic rhinitis due to pollen: Secondary | ICD-10-CM | POA: Diagnosis not present

## 2019-03-23 DIAGNOSIS — J3081 Allergic rhinitis due to animal (cat) (dog) hair and dander: Secondary | ICD-10-CM | POA: Diagnosis not present

## 2019-03-23 DIAGNOSIS — K219 Gastro-esophageal reflux disease without esophagitis: Secondary | ICD-10-CM | POA: Diagnosis not present

## 2019-03-23 DIAGNOSIS — Z91013 Allergy to seafood: Secondary | ICD-10-CM | POA: Diagnosis not present

## 2019-03-24 DIAGNOSIS — F439 Reaction to severe stress, unspecified: Secondary | ICD-10-CM | POA: Diagnosis not present

## 2019-03-24 DIAGNOSIS — F418 Other specified anxiety disorders: Secondary | ICD-10-CM | POA: Diagnosis not present

## 2019-04-19 DIAGNOSIS — Z20828 Contact with and (suspected) exposure to other viral communicable diseases: Secondary | ICD-10-CM | POA: Diagnosis not present

## 2019-05-06 DIAGNOSIS — Z20828 Contact with and (suspected) exposure to other viral communicable diseases: Secondary | ICD-10-CM | POA: Diagnosis not present

## 2019-06-17 DIAGNOSIS — D2272 Melanocytic nevi of left lower limb, including hip: Secondary | ICD-10-CM | POA: Diagnosis not present

## 2019-06-17 DIAGNOSIS — D2262 Melanocytic nevi of left upper limb, including shoulder: Secondary | ICD-10-CM | POA: Diagnosis not present

## 2019-06-17 DIAGNOSIS — D2261 Melanocytic nevi of right upper limb, including shoulder: Secondary | ICD-10-CM | POA: Diagnosis not present

## 2019-06-17 DIAGNOSIS — Z85828 Personal history of other malignant neoplasm of skin: Secondary | ICD-10-CM | POA: Diagnosis not present

## 2019-06-19 DIAGNOSIS — Z86010 Personal history of colonic polyps: Secondary | ICD-10-CM | POA: Diagnosis not present

## 2019-06-19 DIAGNOSIS — K582 Mixed irritable bowel syndrome: Secondary | ICD-10-CM | POA: Diagnosis not present

## 2019-06-29 DIAGNOSIS — F418 Other specified anxiety disorders: Secondary | ICD-10-CM | POA: Diagnosis not present

## 2019-06-29 DIAGNOSIS — F439 Reaction to severe stress, unspecified: Secondary | ICD-10-CM | POA: Diagnosis not present

## 2019-08-17 DIAGNOSIS — Z20822 Contact with and (suspected) exposure to covid-19: Secondary | ICD-10-CM | POA: Diagnosis not present

## 2019-09-02 DIAGNOSIS — H10812 Pingueculitis, left eye: Secondary | ICD-10-CM | POA: Diagnosis not present

## 2019-09-14 DIAGNOSIS — M79605 Pain in left leg: Secondary | ICD-10-CM | POA: Diagnosis not present

## 2019-09-14 DIAGNOSIS — S76102A Unspecified injury of left quadriceps muscle, fascia and tendon, initial encounter: Secondary | ICD-10-CM | POA: Diagnosis not present

## 2019-09-17 DIAGNOSIS — F418 Other specified anxiety disorders: Secondary | ICD-10-CM | POA: Diagnosis not present

## 2019-09-17 DIAGNOSIS — F439 Reaction to severe stress, unspecified: Secondary | ICD-10-CM | POA: Diagnosis not present

## 2019-10-13 DIAGNOSIS — Z85828 Personal history of other malignant neoplasm of skin: Secondary | ICD-10-CM | POA: Diagnosis not present

## 2019-10-13 DIAGNOSIS — L820 Inflamed seborrheic keratosis: Secondary | ICD-10-CM | POA: Diagnosis not present

## 2019-11-19 DIAGNOSIS — F418 Other specified anxiety disorders: Secondary | ICD-10-CM | POA: Diagnosis not present

## 2020-01-27 DIAGNOSIS — F418 Other specified anxiety disorders: Secondary | ICD-10-CM | POA: Diagnosis not present

## 2020-02-07 DIAGNOSIS — U071 COVID-19: Secondary | ICD-10-CM | POA: Diagnosis not present

## 2020-02-08 ENCOUNTER — Other Ambulatory Visit: Payer: Self-pay | Admitting: Physician Assistant

## 2020-02-08 DIAGNOSIS — U071 COVID-19: Secondary | ICD-10-CM

## 2020-02-08 DIAGNOSIS — J4520 Mild intermittent asthma, uncomplicated: Secondary | ICD-10-CM

## 2020-02-08 DIAGNOSIS — R002 Palpitations: Secondary | ICD-10-CM

## 2020-02-08 NOTE — Progress Notes (Signed)
I connected by phone with Laura [name] on 02/08/2020 at 1:05 PM to discuss the potential use of a new treatment for mild to moderate COVID-19 viral infection in non-hospitalized patients.  This patient is a 44 y.o. female that meets the FDA criteria for Emergency Use Authorization of COVID monoclonal antibody casirivimab/imdevimab.  Has a (+) direct SARS-CoV-2 viral test result  Has mild or moderate COVID-19   Is NOT hospitalized due to COVID-19  Is within 10 days of symptom onset  Has at least one of the high risk factor(s) for progression to severe COVID-19 and/or hospitalization as defined in EUA.  Specific high risk criteria : Chronic Lung Disease   I have spoken and communicated the following to the patient or parent/caregiver regarding COVID monoclonal antibody treatment:  1. FDA has authorized the emergency use for the treatment of mild to moderate COVID-19 in adults and pediatric patients with positive results of direct SARS-CoV-2 viral testing who are 74 years of age and older weighing at least 40 kg, and who are at high risk for progressing to severe COVID-19 and/or hospitalization.  2. The significant known and potential risks and benefits of COVID monoclonal antibody, and the extent to which such potential risks and benefits are unknown.  3. Information on available alternative treatments and the risks and benefits of those alternatives, including clinical trials.  4. Patients treated with COVID monoclonal antibody should continue to self-isolate and use infection control measures (e.g., wear mask, isolate, social distance, avoid sharing personal items, clean and disinfect "high touch" surfaces, and frequent handwashing) according to CDC guidelines.   5. The patient or parent/caregiver has the option to accept or refuse COVID monoclonal antibody treatment.  After reviewing this information with the patient, The patient agreed to proceed with receiving casirivimab\imdevimab  infusion and will be provided a copy of the Fact sheet prior to receiving the infusion.   Dawn Convery 02/08/2020 1:05 PM

## 2020-02-09 ENCOUNTER — Ambulatory Visit (HOSPITAL_COMMUNITY)
Admission: RE | Admit: 2020-02-09 | Discharge: 2020-02-09 | Disposition: A | Discharge status: Home / Self Care | Payer: BC Managed Care – PPO | Source: Ambulatory Visit | Attending: Pulmonary Disease | Admitting: Pulmonary Disease

## 2020-02-09 ENCOUNTER — Other Ambulatory Visit (HOSPITAL_COMMUNITY): Payer: Self-pay

## 2020-02-09 ENCOUNTER — Ambulatory Visit (HOSPITAL_COMMUNITY): Payer: BC Managed Care – PPO

## 2020-02-09 DIAGNOSIS — U071 COVID-19: Secondary | ICD-10-CM

## 2020-02-09 MED ORDER — SODIUM CHLORIDE 0.9 % IV SOLN: INTRAVENOUS | Status: DC | PRN

## 2020-02-09 MED ORDER — FAMOTIDINE IN NACL 20-0.9 MG/50ML-% IV SOLN: 20.0000 mg | Freq: Once | INTRAVENOUS | Status: DC | PRN

## 2020-02-09 MED ORDER — DIPHENHYDRAMINE HCL 50 MG/ML IJ SOLN: 50.0000 mg | Freq: Once | INTRAMUSCULAR | Status: DC | PRN

## 2020-02-09 MED ORDER — METHYLPREDNISOLONE SODIUM SUCC 125 MG IJ SOLR: 125.0000 mg | Freq: Once | INTRAMUSCULAR | Status: DC | PRN

## 2020-02-09 MED ORDER — EPINEPHRINE 0.3 MG/0.3ML IJ SOAJ: 0.3000 mg | Freq: Once | INTRAMUSCULAR | Status: DC | PRN

## 2020-02-09 MED ORDER — ALBUTEROL SULFATE HFA 108 (90 BASE) MCG/ACT IN AERS: 2.0000 | INHALATION_SPRAY | Freq: Once | RESPIRATORY_TRACT | Status: DC | PRN

## 2020-02-09 MED ORDER — SODIUM CHLORIDE 0.9 % IV SOLN
1200.0000 mg | Freq: Once | INTRAVENOUS | Status: AC
  Administered 2020-02-09: 1200 mg via INTRAVENOUS

## 2020-02-09 NOTE — Progress Notes (Signed)
  Diagnosis: COVID-19  Physician:Dr Wright   Procedure: [facility] Med: casirivimab\imdevimab infusion - Provided patient with casirivimab\imdevimab fact sheet for patients, parents and caregivers prior to infusion.  Complications: No immediate complications noted.  Discharge: Discharged home   Laura [name] 02/09/2020  

## 2020-02-09 NOTE — Discharge Instructions (Signed)

## 2020-02-16 DIAGNOSIS — F418 Other specified anxiety disorders: Secondary | ICD-10-CM | POA: Diagnosis not present

## 2020-03-10 DIAGNOSIS — Z6824 Body mass index (BMI) 24.0-24.9, adult: Secondary | ICD-10-CM | POA: Diagnosis not present

## 2020-03-10 DIAGNOSIS — Z1329 Encounter for screening for other suspected endocrine disorder: Secondary | ICD-10-CM | POA: Diagnosis not present

## 2020-03-10 DIAGNOSIS — Z13228 Encounter for screening for other metabolic disorders: Secondary | ICD-10-CM | POA: Diagnosis not present

## 2020-03-10 DIAGNOSIS — Z01419 Encounter for gynecological examination (general) (routine) without abnormal findings: Secondary | ICD-10-CM | POA: Diagnosis not present

## 2020-03-10 DIAGNOSIS — Z13220 Encounter for screening for lipoid disorders: Secondary | ICD-10-CM | POA: Diagnosis not present

## 2020-03-10 DIAGNOSIS — Z1231 Encounter for screening mammogram for malignant neoplasm of breast: Secondary | ICD-10-CM | POA: Diagnosis not present

## 2020-03-17 DIAGNOSIS — F418 Other specified anxiety disorders: Secondary | ICD-10-CM | POA: Diagnosis not present

## 2020-03-21 DIAGNOSIS — K219 Gastro-esophageal reflux disease without esophagitis: Secondary | ICD-10-CM | POA: Diagnosis not present

## 2020-03-21 DIAGNOSIS — Z91013 Allergy to seafood: Secondary | ICD-10-CM | POA: Diagnosis not present

## 2020-03-21 DIAGNOSIS — J301 Allergic rhinitis due to pollen: Secondary | ICD-10-CM | POA: Diagnosis not present

## 2020-03-21 DIAGNOSIS — J3081 Allergic rhinitis due to animal (cat) (dog) hair and dander: Secondary | ICD-10-CM | POA: Diagnosis not present

## 2020-05-02 DIAGNOSIS — F418 Other specified anxiety disorders: Secondary | ICD-10-CM | POA: Diagnosis not present

## 2020-06-14 DIAGNOSIS — Z86010 Personal history of colonic polyps: Secondary | ICD-10-CM | POA: Diagnosis not present

## 2020-06-14 DIAGNOSIS — R140 Abdominal distension (gaseous): Secondary | ICD-10-CM | POA: Diagnosis not present

## 2020-06-14 DIAGNOSIS — K582 Mixed irritable bowel syndrome: Secondary | ICD-10-CM | POA: Diagnosis not present

## 2020-09-09 IMAGING — CT CT ABDOMEN AND PELVIS WITH CONTRAST
1 of 4 series · 13 of 32 positions shown, 18 images · IV contrast (OMNIPAQUE 300)
Comparison: Lumbar MRI 06/19/2018

CLINICAL DATA: Acute, generalized abdominal pain with diarrhea

EXAM:
CT ABDOMEN AND PELVIS WITH CONTRAST
TECHNIQUE: Multidetector CT imaging of the abdomen and pelvis was performed
using the standard protocol following bolus administration of
intravenous contrast.
CONTRAST:  100mL OMNIPAQUE IOHEXOL 300 MG/ML  SOLN

[Series 2: axial st · axial · 0.68mm/px · z∈[-573,-208]mm · 13 of 85 slices shown, 18 images]
[im 6/85  soft-tissue]
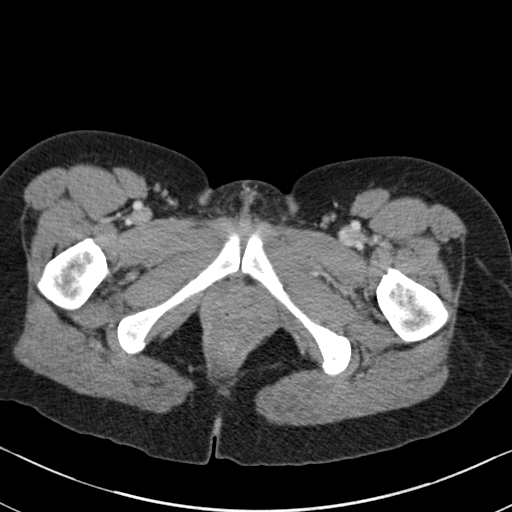
[im 6/85  bone]
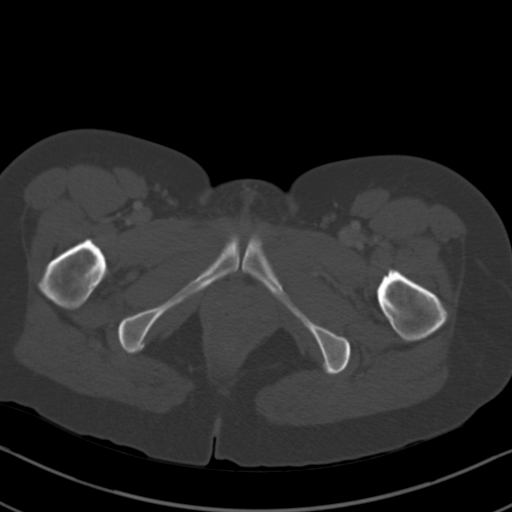
[im 11/85  soft-tissue]
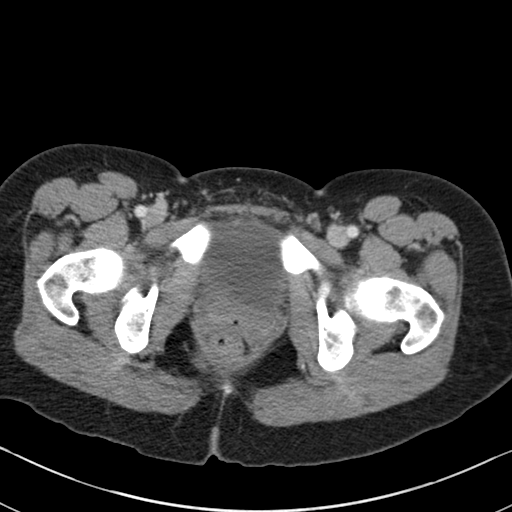
[im 22/85  soft-tissue]
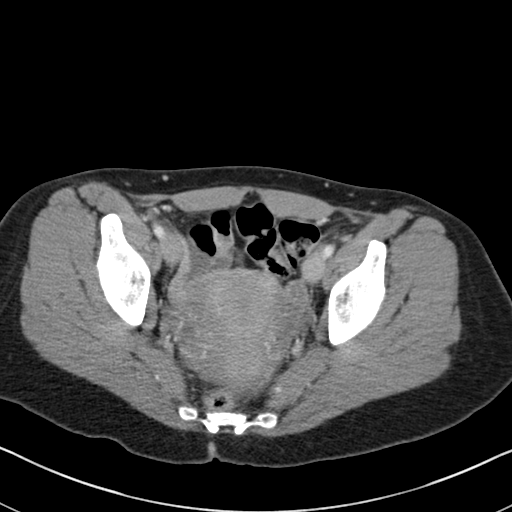
[im 27/85  soft-tissue]
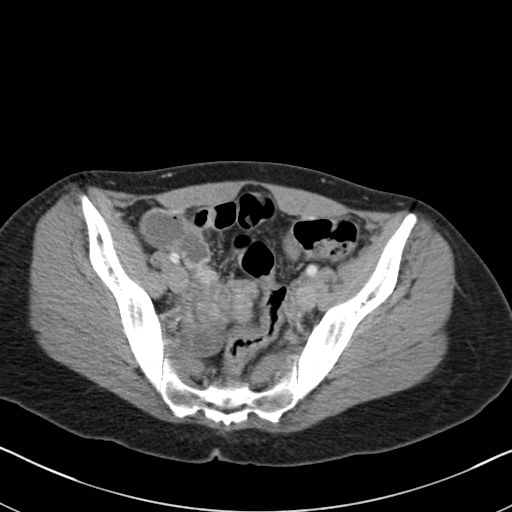
[im 32/85  soft-tissue]
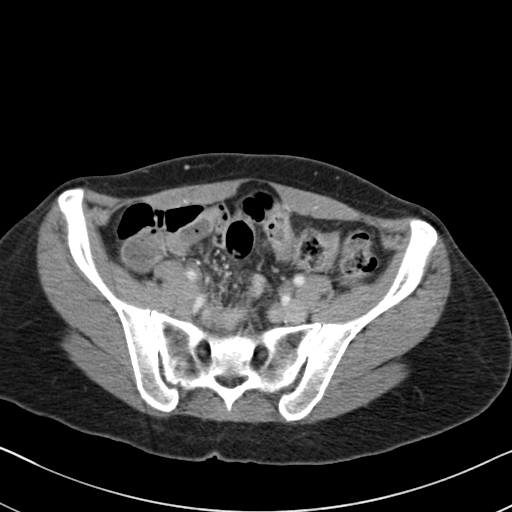
[im 37/85  soft-tissue]
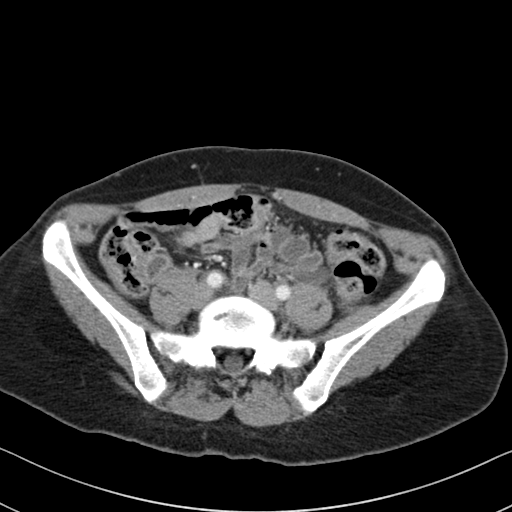
[im 48/85  soft-tissue]
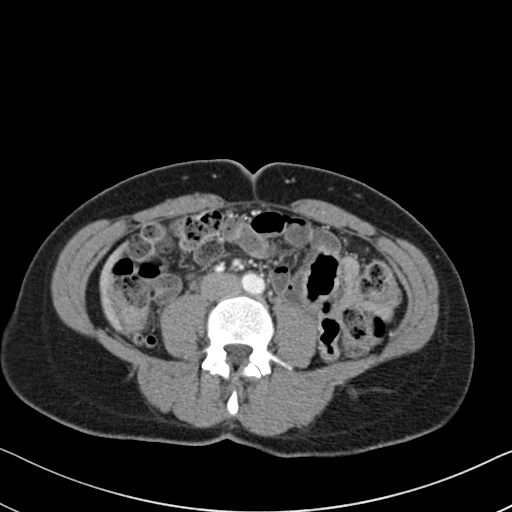
[im 53/85  soft-tissue]
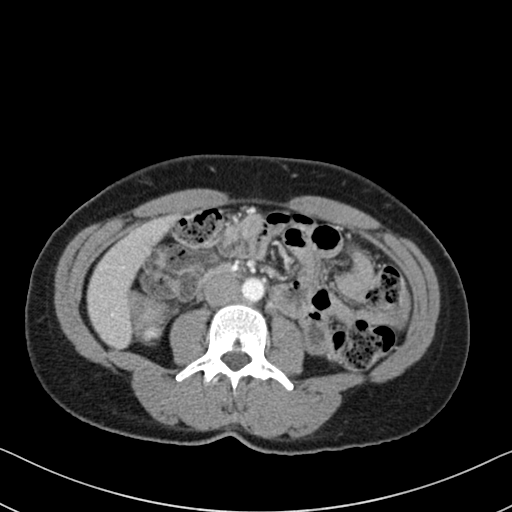
[im 58/85  soft-tissue]
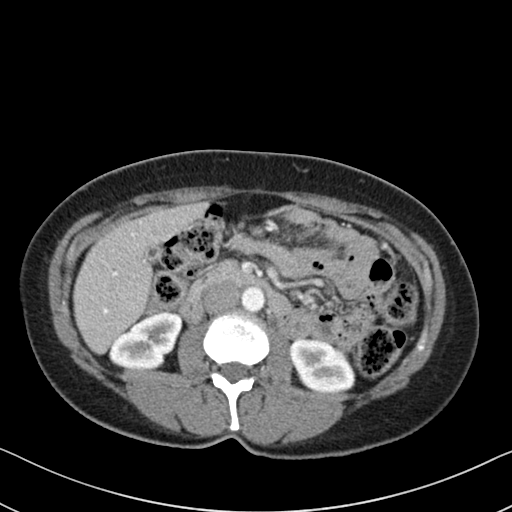
[im 58/85  bone]
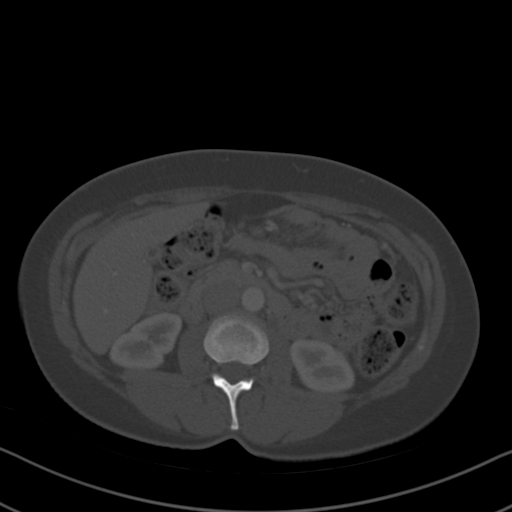
[im 64/85  soft-tissue]
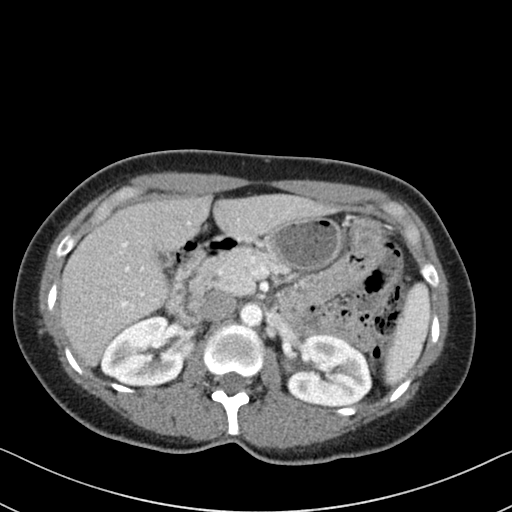
[im 64/85  lung]
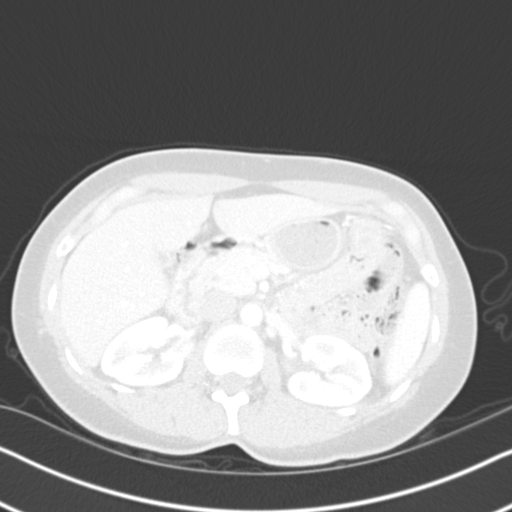
[im 69/85  lung]
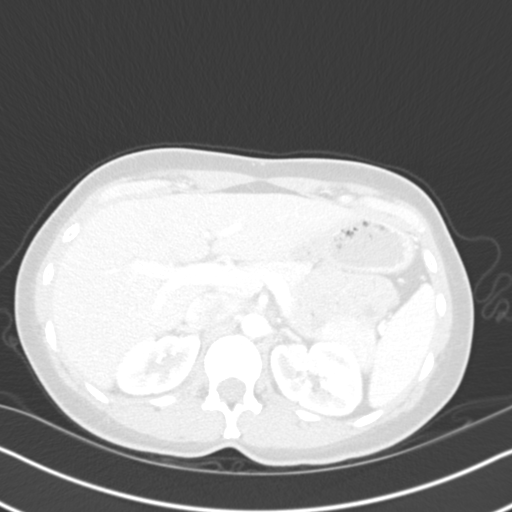
[im 74/85  soft-tissue]
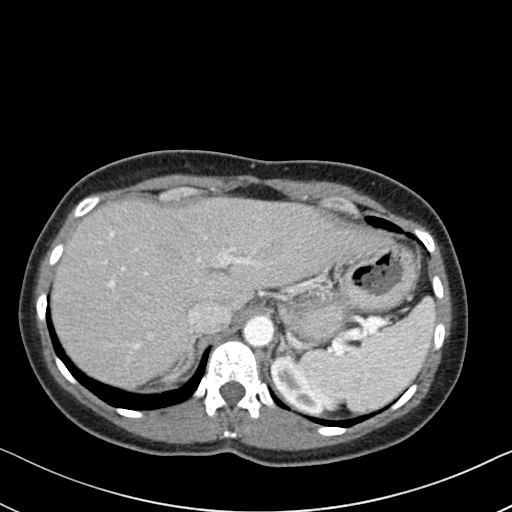
[im 74/85  lung]
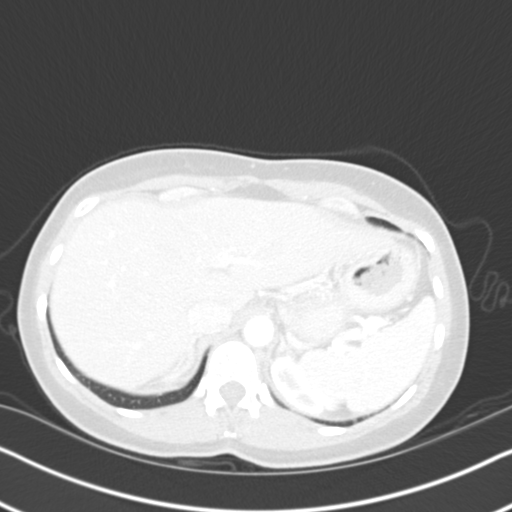
[im 79/85  soft-tissue]
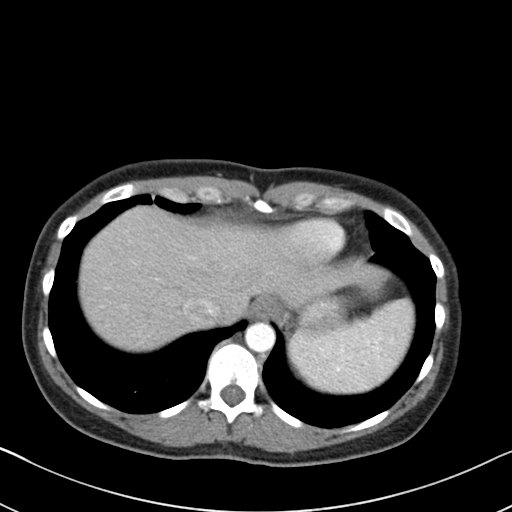
[im 79/85  lung]
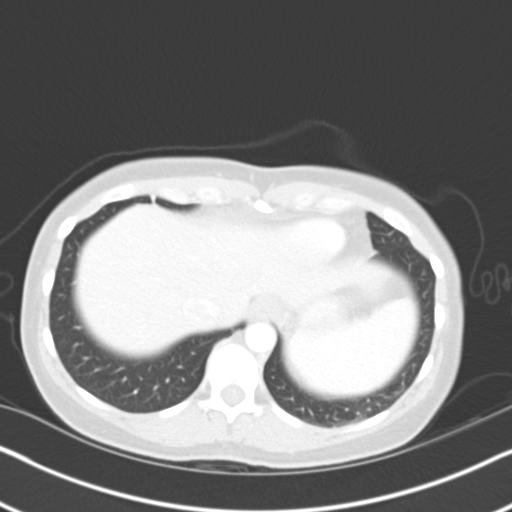

[13 of 32 positions shown; findings below may reference images not displayed]

FINDINGS: Lower chest: Few bandlike areas of scarring and/or atelectasis. Lung
bases are otherwise clear. Normal heart size. No pericardial
effusion.

Hepatobiliary: Subcentimeter hypoattenuating focus near the dome of
the right liver (2/80) too small to fully characterize on CT imaging
but statistically likely benign. No concerning liver abnormality is
seen. Gallbladder is largely decompressed at the time of exam and
therefore poorly evaluated by CT. No visible gallstones, gallbladder
wall thickening, or biliary dilatation.

Pancreas: Unremarkable. No pancreatic ductal dilatation or
surrounding inflammatory changes.

Spleen: Normal in size without focal abnormality.

Adrenals/Urinary Tract: Adrenal glands are unremarkable. Kidneys are
normal, without renal calculi, focal lesion, or hydronephrosis.
Bladder is unremarkable.

Stomach/Bowel: Difficult evaluation of the bowel and mesentery due
to a paucity of intraperitoneal fat. Distal esophagus, stomach and
duodenal sweep are unremarkable. There is a cluster of small bowel
loops in the right upper quadrant which demonstrate internal
fecalization of the contents. No abrupt transition point is
identified however. There is mild mural thickening and mucosal
hyperenhancement of the terminal ileum and slight mural thickening
of the colon throughout its course most pronounced towards the level
of the rectosigmoid. No extraluminal gas or collection is seen

Vascular/Lymphatic: No significant vascular findings are present. No
enlarged abdominal or pelvic lymph nodes.

Reproductive: Anteverted uterus. Heterogeneous enhancement of the
uterus may be related to contrast timing. No concerning adnexal
lesions.

Other: No abdominopelvic free fluid or free gas. No bowel containing
hernias.

Musculoskeletal: Global disc bulge at L5-S1 with no significant
stenosis. Osseous structures are otherwise unremarkable.
IMPRESSION: Mild mural thickening and mucosal hyperenhancement of the terminal
ileum and slight mural thickening of the colon throughout its course
most pronounced towards the level of the rectosigmoid, suggestive of
infectious/inflammatory enterocolitis. No evidence of perforation or
abscess formation.

Cluster of small bowel loops in the right upper quadrant with
fecalized contents, nonspecific but may suggest slowed intestinal
transit.

## 2020-10-03 IMAGING — US US ABDOMEN LIMITED
1 series · 14 of 25 positions shown · non-contrast
Comparison: CT abdomen and pelvis January 09, 2019

CLINICAL DATA: Right upper quadrant pain

EXAM:
ULTRASOUND ABDOMEN LIMITED RIGHT UPPER QUADRANT

[Series 1: us abdomen limited · 0.20mm/px · 14 of 55 slices shown]
[im 1/55]
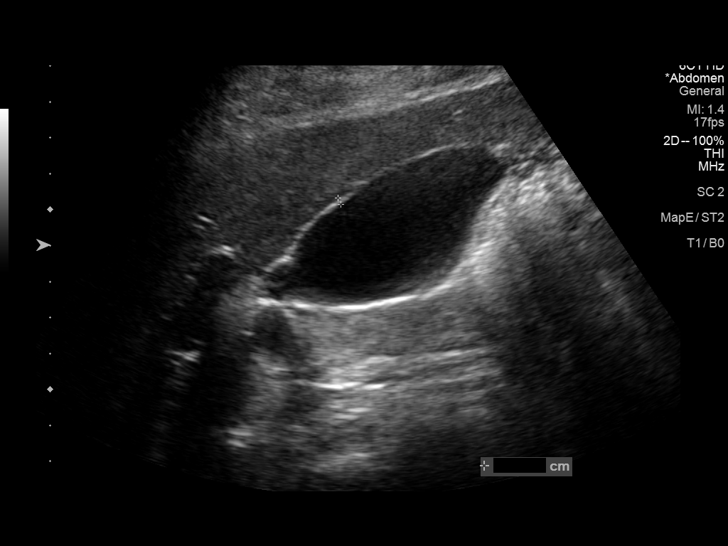
[im 5/55]
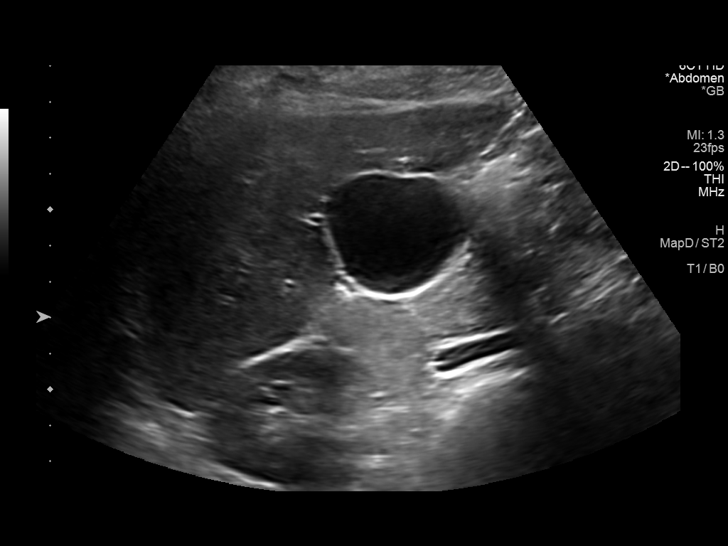
[im 10/55]
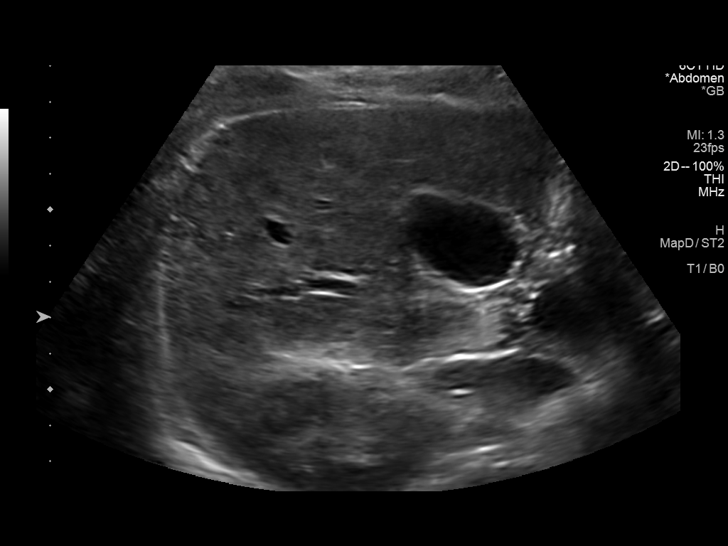
[im 14/55]
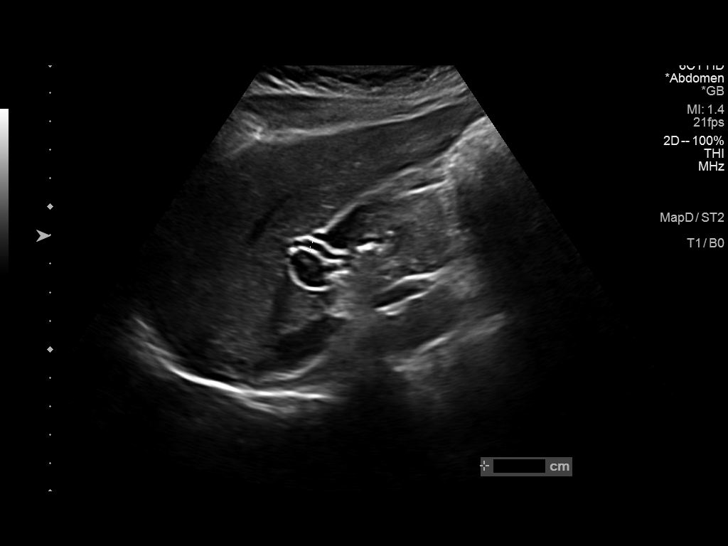
[im 19/55]
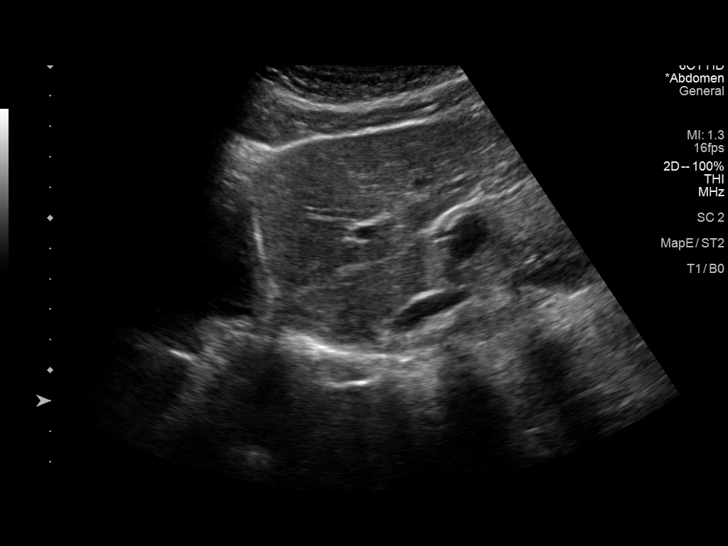
[im 21/55]
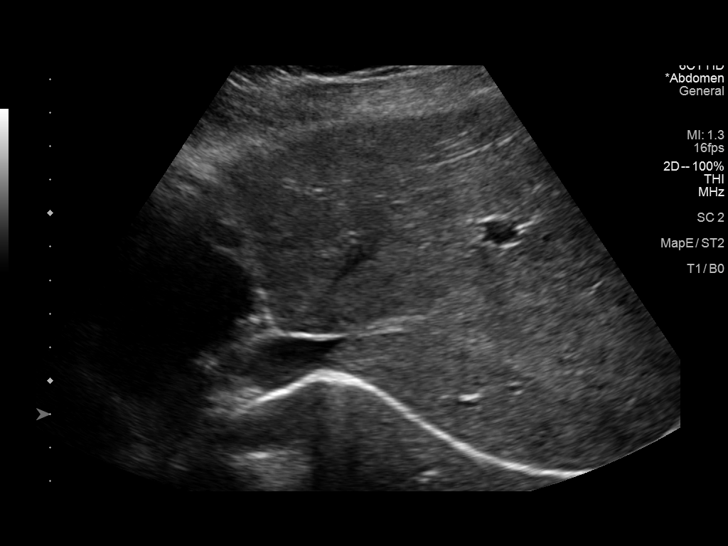
[im 25/55]
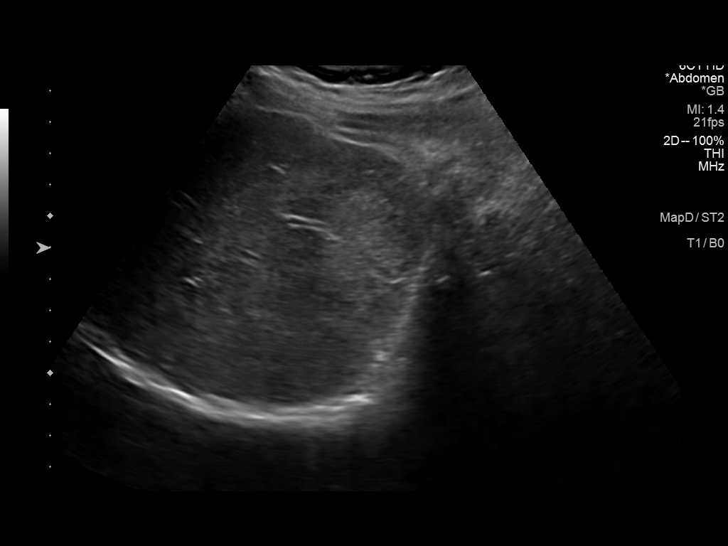
[im 30/55]
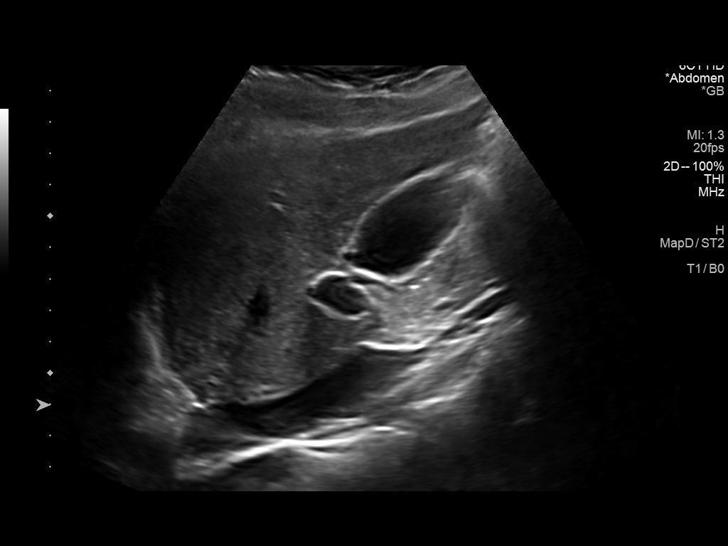
[im 34/55]
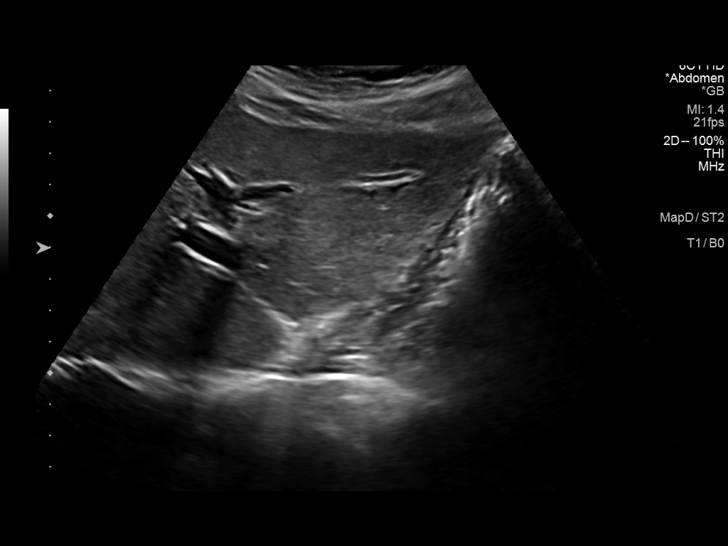
[im 37/55]
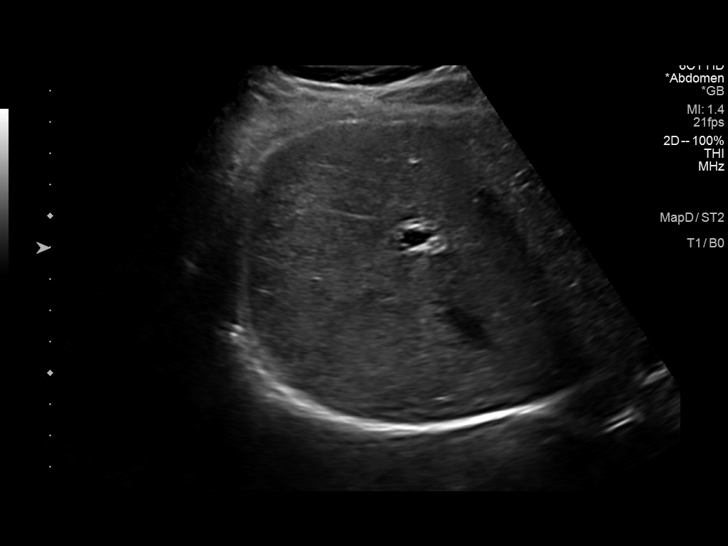
[im 41/55]
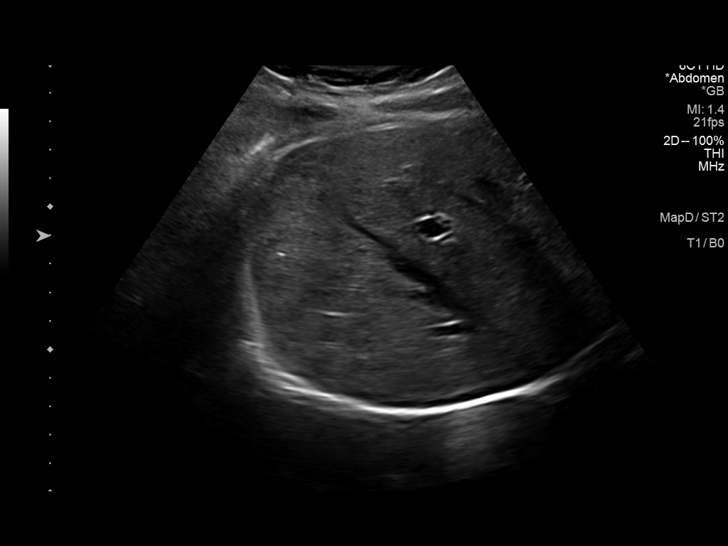
[im 46/55]
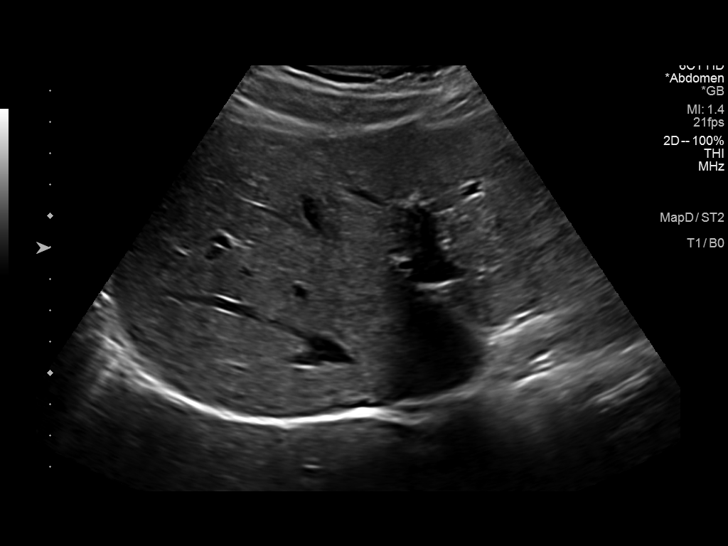
[im 50/55]
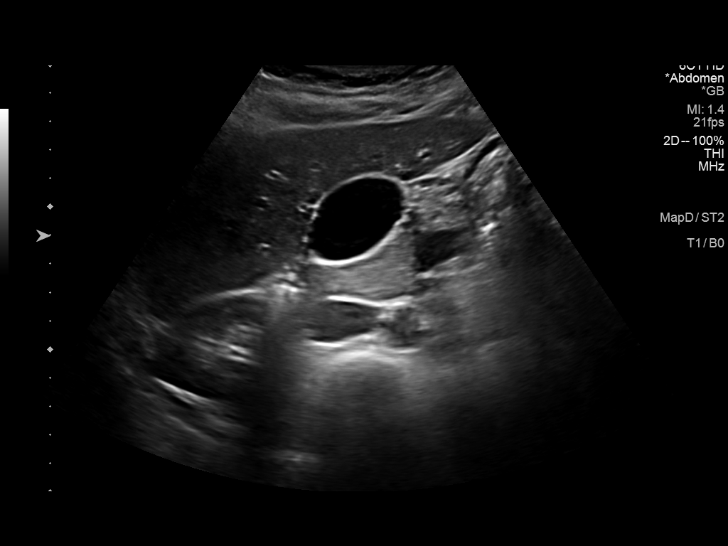
[im 55/55]
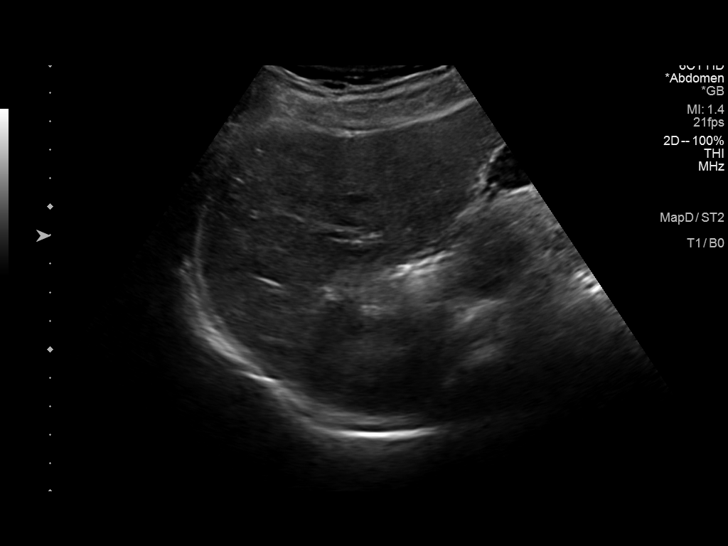

[14 of 25 positions shown; findings below may reference images not displayed]

FINDINGS: Gallbladder:

No gallstones or wall thickening visualized. There is no
pericholecystic fluid. No sonographic Murphy sign noted by
sonographer.

Common bile duct:

Diameter: 3 mm. No intrahepatic or extrahepatic biliary duct
dilatation.

Liver:

No focal lesion identified. Small focus of decreased attenuation
seen in the region of the dome of the liver on the right on recent
CT not appreciable by ultrasound. Within normal limits in
parenchymal echogenicity. Portal vein is patent on color Doppler
imaging with normal direction of blood flow towards the liver.

Other: None.
IMPRESSION: Study within normal limits.

## 2020-11-09 DIAGNOSIS — R32 Unspecified urinary incontinence: Secondary | ICD-10-CM | POA: Diagnosis not present

## 2020-11-09 DIAGNOSIS — N393 Stress incontinence (female) (male): Secondary | ICD-10-CM | POA: Diagnosis not present

## 2020-11-09 DIAGNOSIS — R82998 Other abnormal findings in urine: Secondary | ICD-10-CM | POA: Diagnosis not present

## 2020-11-28 DIAGNOSIS — D2262 Melanocytic nevi of left upper limb, including shoulder: Secondary | ICD-10-CM | POA: Diagnosis not present

## 2020-11-28 DIAGNOSIS — D2261 Melanocytic nevi of right upper limb, including shoulder: Secondary | ICD-10-CM | POA: Diagnosis not present

## 2020-11-28 DIAGNOSIS — C44519 Basal cell carcinoma of skin of other part of trunk: Secondary | ICD-10-CM | POA: Diagnosis not present

## 2020-11-28 DIAGNOSIS — L570 Actinic keratosis: Secondary | ICD-10-CM | POA: Diagnosis not present

## 2020-11-28 DIAGNOSIS — C44612 Basal cell carcinoma of skin of right upper limb, including shoulder: Secondary | ICD-10-CM | POA: Diagnosis not present

## 2020-11-28 DIAGNOSIS — Z85828 Personal history of other malignant neoplasm of skin: Secondary | ICD-10-CM | POA: Diagnosis not present

## 2020-12-09 DIAGNOSIS — Z0000 Encounter for general adult medical examination without abnormal findings: Secondary | ICD-10-CM | POA: Diagnosis not present

## 2020-12-09 DIAGNOSIS — E049 Nontoxic goiter, unspecified: Secondary | ICD-10-CM | POA: Diagnosis not present

## 2020-12-16 DIAGNOSIS — Z0000 Encounter for general adult medical examination without abnormal findings: Secondary | ICD-10-CM | POA: Diagnosis not present

## 2020-12-16 DIAGNOSIS — Z1212 Encounter for screening for malignant neoplasm of rectum: Secondary | ICD-10-CM | POA: Diagnosis not present

## 2020-12-16 DIAGNOSIS — J45909 Unspecified asthma, uncomplicated: Secondary | ICD-10-CM | POA: Diagnosis not present

## 2020-12-16 DIAGNOSIS — R82998 Other abnormal findings in urine: Secondary | ICD-10-CM | POA: Diagnosis not present

## 2021-01-05 DIAGNOSIS — M7541 Impingement syndrome of right shoulder: Secondary | ICD-10-CM | POA: Diagnosis not present

## 2021-01-05 DIAGNOSIS — M7542 Impingement syndrome of left shoulder: Secondary | ICD-10-CM | POA: Diagnosis not present

## 2021-01-11 DIAGNOSIS — M7541 Impingement syndrome of right shoulder: Secondary | ICD-10-CM | POA: Diagnosis not present

## 2021-01-11 DIAGNOSIS — M7542 Impingement syndrome of left shoulder: Secondary | ICD-10-CM | POA: Diagnosis not present

## 2021-01-16 DIAGNOSIS — N393 Stress incontinence (female) (male): Secondary | ICD-10-CM | POA: Diagnosis not present

## 2021-03-13 DIAGNOSIS — Z01419 Encounter for gynecological examination (general) (routine) without abnormal findings: Secondary | ICD-10-CM | POA: Diagnosis not present

## 2021-03-13 DIAGNOSIS — Z6823 Body mass index (BMI) 23.0-23.9, adult: Secondary | ICD-10-CM | POA: Diagnosis not present

## 2021-03-13 DIAGNOSIS — Z1231 Encounter for screening mammogram for malignant neoplasm of breast: Secondary | ICD-10-CM | POA: Diagnosis not present

## 2021-03-14 DIAGNOSIS — F418 Other specified anxiety disorders: Secondary | ICD-10-CM | POA: Diagnosis not present

## 2021-03-21 DIAGNOSIS — J3089 Other allergic rhinitis: Secondary | ICD-10-CM | POA: Diagnosis not present

## 2021-03-21 DIAGNOSIS — K219 Gastro-esophageal reflux disease without esophagitis: Secondary | ICD-10-CM | POA: Diagnosis not present

## 2021-03-21 DIAGNOSIS — Z91013 Allergy to seafood: Secondary | ICD-10-CM | POA: Diagnosis not present

## 2021-03-21 DIAGNOSIS — J301 Allergic rhinitis due to pollen: Secondary | ICD-10-CM | POA: Diagnosis not present

## 2021-05-04 DIAGNOSIS — F418 Other specified anxiety disorders: Secondary | ICD-10-CM | POA: Diagnosis not present

## 2021-05-19 DIAGNOSIS — M25511 Pain in right shoulder: Secondary | ICD-10-CM | POA: Diagnosis not present

## 2021-05-30 DIAGNOSIS — M25511 Pain in right shoulder: Secondary | ICD-10-CM | POA: Diagnosis not present

## 2021-05-30 DIAGNOSIS — M75101 Unspecified rotator cuff tear or rupture of right shoulder, not specified as traumatic: Secondary | ICD-10-CM | POA: Diagnosis not present

## 2021-06-12 DIAGNOSIS — M25511 Pain in right shoulder: Secondary | ICD-10-CM | POA: Diagnosis not present

## 2021-06-21 DIAGNOSIS — R5383 Other fatigue: Secondary | ICD-10-CM | POA: Diagnosis not present

## 2021-07-07 DIAGNOSIS — R300 Dysuria: Secondary | ICD-10-CM | POA: Diagnosis not present

## 2021-07-07 DIAGNOSIS — N390 Urinary tract infection, site not specified: Secondary | ICD-10-CM | POA: Diagnosis not present

## 2021-07-11 DIAGNOSIS — Z79899 Other long term (current) drug therapy: Secondary | ICD-10-CM | POA: Diagnosis not present

## 2021-07-19 DIAGNOSIS — F418 Other specified anxiety disorders: Secondary | ICD-10-CM | POA: Diagnosis not present

## 2021-08-01 DIAGNOSIS — F418 Other specified anxiety disorders: Secondary | ICD-10-CM | POA: Diagnosis not present

## 2021-08-01 DIAGNOSIS — L918 Other hypertrophic disorders of the skin: Secondary | ICD-10-CM | POA: Diagnosis not present

## 2021-08-01 DIAGNOSIS — D2262 Melanocytic nevi of left upper limb, including shoulder: Secondary | ICD-10-CM | POA: Diagnosis not present

## 2021-08-01 DIAGNOSIS — D2261 Melanocytic nevi of right upper limb, including shoulder: Secondary | ICD-10-CM | POA: Diagnosis not present

## 2021-08-01 DIAGNOSIS — Z85828 Personal history of other malignant neoplasm of skin: Secondary | ICD-10-CM | POA: Diagnosis not present

## 2021-08-18 DIAGNOSIS — F418 Other specified anxiety disorders: Secondary | ICD-10-CM | POA: Diagnosis not present

## 2021-09-07 DIAGNOSIS — F418 Other specified anxiety disorders: Secondary | ICD-10-CM | POA: Diagnosis not present

## 2021-10-09 DIAGNOSIS — R2241 Localized swelling, mass and lump, right lower limb: Secondary | ICD-10-CM | POA: Diagnosis not present

## 2021-10-09 DIAGNOSIS — M79671 Pain in right foot: Secondary | ICD-10-CM | POA: Diagnosis not present

## 2021-10-30 DIAGNOSIS — R6882 Decreased libido: Secondary | ICD-10-CM | POA: Diagnosis not present

## 2021-10-30 DIAGNOSIS — R5383 Other fatigue: Secondary | ICD-10-CM | POA: Diagnosis not present

## 2021-10-30 DIAGNOSIS — N951 Menopausal and female climacteric states: Secondary | ICD-10-CM | POA: Diagnosis not present

## 2021-10-30 DIAGNOSIS — Z7409 Other reduced mobility: Secondary | ICD-10-CM | POA: Diagnosis not present

## 2021-10-30 DIAGNOSIS — Z1329 Encounter for screening for other suspected endocrine disorder: Secondary | ICD-10-CM | POA: Diagnosis not present

## 2021-10-30 DIAGNOSIS — E559 Vitamin D deficiency, unspecified: Secondary | ICD-10-CM | POA: Diagnosis not present

## 2021-10-30 DIAGNOSIS — Z131 Encounter for screening for diabetes mellitus: Secondary | ICD-10-CM | POA: Diagnosis not present

## 2021-10-30 DIAGNOSIS — E538 Deficiency of other specified B group vitamins: Secondary | ICD-10-CM | POA: Diagnosis not present

## 2021-10-30 DIAGNOSIS — K589 Irritable bowel syndrome without diarrhea: Secondary | ICD-10-CM | POA: Diagnosis not present

## 2021-11-03 DIAGNOSIS — F418 Other specified anxiety disorders: Secondary | ICD-10-CM | POA: Diagnosis not present

## 2021-11-09 DIAGNOSIS — J069 Acute upper respiratory infection, unspecified: Secondary | ICD-10-CM | POA: Diagnosis not present

## 2021-11-09 DIAGNOSIS — N390 Urinary tract infection, site not specified: Secondary | ICD-10-CM | POA: Diagnosis not present

## 2021-11-09 DIAGNOSIS — R0981 Nasal congestion: Secondary | ICD-10-CM | POA: Diagnosis not present

## 2021-11-28 DIAGNOSIS — Z77120 Contact with and (suspected) exposure to mold (toxic): Secondary | ICD-10-CM | POA: Diagnosis not present

## 2021-11-28 DIAGNOSIS — M25511 Pain in right shoulder: Secondary | ICD-10-CM | POA: Diagnosis not present

## 2021-11-28 DIAGNOSIS — M6289 Other specified disorders of muscle: Secondary | ICD-10-CM | POA: Diagnosis not present

## 2021-11-28 DIAGNOSIS — N951 Menopausal and female climacteric states: Secondary | ICD-10-CM | POA: Diagnosis not present

## 2021-12-01 DIAGNOSIS — M25511 Pain in right shoulder: Secondary | ICD-10-CM | POA: Diagnosis not present

## 2021-12-01 DIAGNOSIS — M6289 Other specified disorders of muscle: Secondary | ICD-10-CM | POA: Diagnosis not present

## 2021-12-01 DIAGNOSIS — N951 Menopausal and female climacteric states: Secondary | ICD-10-CM | POA: Diagnosis not present

## 2021-12-01 DIAGNOSIS — Z77120 Contact with and (suspected) exposure to mold (toxic): Secondary | ICD-10-CM | POA: Diagnosis not present

## 2022-01-23 DIAGNOSIS — E049 Nontoxic goiter, unspecified: Secondary | ICD-10-CM | POA: Diagnosis not present

## 2022-01-23 DIAGNOSIS — Z0000 Encounter for general adult medical examination without abnormal findings: Secondary | ICD-10-CM | POA: Diagnosis not present

## 2022-01-25 DIAGNOSIS — R5383 Other fatigue: Secondary | ICD-10-CM | POA: Diagnosis not present

## 2022-01-25 DIAGNOSIS — F418 Other specified anxiety disorders: Secondary | ICD-10-CM | POA: Diagnosis not present

## 2022-01-25 DIAGNOSIS — M6289 Other specified disorders of muscle: Secondary | ICD-10-CM | POA: Diagnosis not present

## 2022-01-25 DIAGNOSIS — N951 Menopausal and female climacteric states: Secondary | ICD-10-CM | POA: Diagnosis not present

## 2022-01-25 DIAGNOSIS — Z77120 Contact with and (suspected) exposure to mold (toxic): Secondary | ICD-10-CM | POA: Diagnosis not present

## 2022-01-25 DIAGNOSIS — M25511 Pain in right shoulder: Secondary | ICD-10-CM | POA: Diagnosis not present

## 2022-01-25 DIAGNOSIS — E559 Vitamin D deficiency, unspecified: Secondary | ICD-10-CM | POA: Diagnosis not present

## 2022-01-30 DIAGNOSIS — Z1331 Encounter for screening for depression: Secondary | ICD-10-CM | POA: Diagnosis not present

## 2022-01-30 DIAGNOSIS — Z0000 Encounter for general adult medical examination without abnormal findings: Secondary | ICD-10-CM | POA: Diagnosis not present

## 2022-01-30 DIAGNOSIS — F418 Other specified anxiety disorders: Secondary | ICD-10-CM | POA: Diagnosis not present

## 2022-01-30 DIAGNOSIS — J45909 Unspecified asthma, uncomplicated: Secondary | ICD-10-CM | POA: Diagnosis not present

## 2022-01-30 DIAGNOSIS — R82998 Other abnormal findings in urine: Secondary | ICD-10-CM | POA: Diagnosis not present

## 2022-01-30 DIAGNOSIS — Z1339 Encounter for screening examination for other mental health and behavioral disorders: Secondary | ICD-10-CM | POA: Diagnosis not present

## 2022-01-31 ENCOUNTER — Encounter: Payer: BC Managed Care – PPO | Admitting: Sports Medicine

## 2022-02-07 ENCOUNTER — Ambulatory Visit (INDEPENDENT_AMBULATORY_CARE_PROVIDER_SITE_OTHER): Payer: BC Managed Care – PPO | Admitting: Sports Medicine

## 2022-02-07 VITALS — BP 130/72 | Ht 66.0 in | Wt 155.0 lb

## 2022-02-07 DIAGNOSIS — R269 Unspecified abnormalities of gait and mobility: Secondary | ICD-10-CM

## 2022-02-07 NOTE — Progress Notes (Unsigned)
  Laura [name] - 46 y.o. female MRN [id number]  Date of birth: 1975-11-07    CHIEF COMPLAINT:   orthotics    SUBJECTIVE:   HPI:  Pleasant 46 year old female here for orthotics.  Was last seen here in 2016 when she had her first custom orthotics made due to ongoing foot pain secondary to her running technique.  These helped her feet dramatically with running over the years but now they are worn out so she is here for replacement.  ROS:     See HPI  PERTINENT  PMH / PSH FH / / SH:  Past Medical, Surgical, Social, and Family History Reviewed & Updated in the EMR.  Pertinent findings include:  none  OBJECTIVE: There were no vitals taken for this visit.  Physical Exam:  Vital signs are reviewed.  GEN: Alert and oriented, NAD Pulm: Breathing unlabored PSY: normal mood, congruent affect  MSK: Feet -good integrity of longitudinal and transverse arch bilaterally.  Early hallux valgus left great toe.  Nontender to palpation at medial and lateral malleolus, navicular, base of fifth, base of calcaneus.  Full range of motion of the ankle.  5/5 strength bilaterally.  Full range of motion at the left great toe.  Symmetric heel inversion with toe raise bilaterally.  Neurovascular intact.  ASSESSMENT & PLAN:  1. Abnormality of Gait -Will proceed with remaking custom orthotics today.  She can follow-up as needed  Patient was fitted for a : standard, cushioned, semi-rigid orthotic. The orthotic was heated and afterward the patient stood on the orthotic blank positioned on the orthotic stand. The patient was positioned in subtalar neutral position and 10 degrees of ankle dorsiflexion in a weight bearing stance. After completion of molding, a stable base was applied to the orthotic blank. The blank was ground to a stable position for weight bearing. Size: 9 Base: blue EVA Posting: none Additional orthotic padding: none  Gait was neutral with orthotics in place. Patient found them to be  comfortable. Follow-up as needed.  Dortha Kern, MD PGY-4, Sports Medicine Fellow [place]  Addendum:  Patient seen in the office by fellow.  His history, exam, plan of care were precepted with me.  Karlton Lemon MD Kirt Boys

## 2022-02-15 DIAGNOSIS — R5383 Other fatigue: Secondary | ICD-10-CM | POA: Diagnosis not present

## 2022-02-15 DIAGNOSIS — N951 Menopausal and female climacteric states: Secondary | ICD-10-CM | POA: Diagnosis not present

## 2022-03-19 DIAGNOSIS — Z6826 Body mass index (BMI) 26.0-26.9, adult: Secondary | ICD-10-CM | POA: Diagnosis not present

## 2022-03-19 DIAGNOSIS — Z1231 Encounter for screening mammogram for malignant neoplasm of breast: Secondary | ICD-10-CM | POA: Diagnosis not present

## 2022-03-19 DIAGNOSIS — Z01419 Encounter for gynecological examination (general) (routine) without abnormal findings: Secondary | ICD-10-CM | POA: Diagnosis not present

## 2022-04-03 DIAGNOSIS — K648 Other hemorrhoids: Secondary | ICD-10-CM | POA: Diagnosis not present

## 2022-04-03 DIAGNOSIS — Z1211 Encounter for screening for malignant neoplasm of colon: Secondary | ICD-10-CM | POA: Diagnosis not present

## 2022-04-09 DIAGNOSIS — M5126 Other intervertebral disc displacement, lumbar region: Secondary | ICD-10-CM | POA: Diagnosis not present

## 2022-04-09 DIAGNOSIS — M5416 Radiculopathy, lumbar region: Secondary | ICD-10-CM | POA: Diagnosis not present

## 2022-04-09 DIAGNOSIS — M542 Cervicalgia: Secondary | ICD-10-CM | POA: Diagnosis not present

## 2022-05-09 DIAGNOSIS — K589 Irritable bowel syndrome without diarrhea: Secondary | ICD-10-CM | POA: Diagnosis not present

## 2022-05-09 DIAGNOSIS — J4520 Mild intermittent asthma, uncomplicated: Secondary | ICD-10-CM | POA: Diagnosis not present

## 2022-05-09 DIAGNOSIS — M25511 Pain in right shoulder: Secondary | ICD-10-CM | POA: Diagnosis not present

## 2022-05-09 DIAGNOSIS — E559 Vitamin D deficiency, unspecified: Secondary | ICD-10-CM | POA: Diagnosis not present

## 2022-05-09 DIAGNOSIS — R5383 Other fatigue: Secondary | ICD-10-CM | POA: Diagnosis not present
